# Patient Record
Sex: Female | Born: 1990
Health system: Southern US, Community
[De-identification: ages and names within clinical notes are randomized; demographics above are authoritative.]

## PROBLEM LIST (undated history)

## (undated) DIAGNOSIS — R011 Cardiac murmur, unspecified: Secondary | ICD-10-CM

## (undated) DIAGNOSIS — F419 Anxiety disorder, unspecified: Secondary | ICD-10-CM

## (undated) HISTORY — PX: NO PAST SURGERIES: SHX2092

## (undated) HISTORY — PX: BACK SURGERY: SHX140

## (undated) HISTORY — DX: Anxiety disorder, unspecified: F41.9

---

## 2008-01-15 ENCOUNTER — Emergency Department (HOSPITAL_COMMUNITY): Admission: EM | Admit: 2008-01-15 | Discharge: 2008-01-15 | Payer: Self-pay | Admitting: Emergency Medicine

## 2010-04-18 LAB — POCT I-STAT, CHEM 8
BUN: 6 mg/dL (ref 6–23)
Calcium, Ion: 1.17 mmol/L (ref 1.12–1.32)
Creatinine, Ser: 1 mg/dL (ref 0.4–1.2)
TCO2: 26 mmol/L (ref 0–100)

## 2010-04-18 LAB — URINALYSIS, ROUTINE W REFLEX MICROSCOPIC
Glucose, UA: NEGATIVE mg/dL
Specific Gravity, Urine: 1.013 (ref 1.005–1.030)
Urobilinogen, UA: 1 mg/dL (ref 0.0–1.0)
pH: 7 (ref 5.0–8.0)

## 2010-04-18 LAB — URINE CULTURE

## 2010-04-18 LAB — URINE MICROSCOPIC-ADD ON

## 2010-04-18 LAB — DIFFERENTIAL
Basophils Absolute: 0 10*3/uL (ref 0.0–0.1)
Eosinophils Relative: 0 % (ref 0–5)
Lymphocytes Relative: 14 % (ref 12–46)
Lymphs Abs: 1.5 10*3/uL (ref 0.7–4.0)
Monocytes Absolute: 0.9 10*3/uL (ref 0.1–1.0)

## 2010-04-18 LAB — CBC
HCT: 37.9 % (ref 36.0–46.0)
Hemoglobin: 12 g/dL (ref 12.0–15.0)
MCV: 79.7 fL (ref 78.0–100.0)
RDW: 13.6 % (ref 11.5–15.5)

## 2012-06-20 ENCOUNTER — Encounter (HOSPITAL_COMMUNITY): Payer: Self-pay | Admitting: *Deleted

## 2012-06-20 ENCOUNTER — Emergency Department (INDEPENDENT_AMBULATORY_CARE_PROVIDER_SITE_OTHER)
Admission: EM | Admit: 2012-06-20 | Discharge: 2012-06-20 | Disposition: A | Discharge status: Home / Self Care | Payer: Self-pay | Source: Home / Self Care | Attending: Family Medicine | Admitting: Family Medicine

## 2012-06-20 DIAGNOSIS — K5289 Other specified noninfective gastroenteritis and colitis: Secondary | ICD-10-CM

## 2012-06-20 DIAGNOSIS — K529 Noninfective gastroenteritis and colitis, unspecified: Secondary | ICD-10-CM

## 2012-06-20 HISTORY — DX: Cardiac murmur, unspecified: R01.1

## 2012-06-20 LAB — POCT URINALYSIS DIP (DEVICE)
Hgb urine dipstick: NEGATIVE
Nitrite: NEGATIVE
Urobilinogen, UA: 0.2 mg/dL (ref 0.0–1.0)
pH: 6.5 (ref 5.0–8.0)

## 2012-06-20 MED ORDER — ONDANSETRON 4 MG PO TBDP
4.0000 mg | ORAL_TABLET | Freq: Once | ORAL | Status: AC
  Administered 2012-06-20: 4 mg via ORAL

## 2012-06-20 MED ORDER — PROMETHAZINE HCL 25 MG PO TABS: 25.0000 mg | ORAL_TABLET | Freq: Three times a day (TID) | ORAL | Status: DC | PRN

## 2012-06-20 MED ORDER — LOPERAMIDE HCL 2 MG PO CAPS: 2.0000 mg | ORAL_CAPSULE | Freq: Four times a day (QID) | ORAL | Status: DC | PRN

## 2012-06-20 MED ORDER — OMEPRAZOLE 20 MG PO CPDR: DELAYED_RELEASE_CAPSULE | ORAL | Status: DC

## 2012-06-20 MED ORDER — ONDANSETRON 4 MG PO TBDP
ORAL_TABLET | ORAL | Status: AC
  Filled 2012-06-20: qty 1

## 2012-06-20 NOTE — ED Provider Notes (Signed)
History     CSN: [id number]  Arrival date & time 06/20/12  1542   First MD Initiated Contact with Patient 06/20/12 1557      Chief Complaint  Patient presents with  . Emesis  . Diarrhea    (Consider location/radiation/quality/duration/timing/severity/associated sxs/prior treatment) HPI Comments: 22 year old smoker female. Here complaining of nausea vomiting and diarrhea since waking up this morning. States she has about 5 episodes of vomiting with no blood mostly food content last time about 2 hours ago also has had about 3 episodes of nonbloody diarrhea. Denies abdominal pain. No headache or dizziness. No jaundice. Patient states that today is her birthday and she makes some alcohol drinks last night. She denies being a heavy drinker or recent alcohol binge. No history of jaundice.   Past Medical History  Diagnosis Date  . 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 No pertinent past surgical history.  History reviewed. No pertinent family history.  History  Substance Use Topics  . Smoking status: Current Every Day Smoker -- 0.10 packs/day    Types: Cigarettes  . Smokeless tobacco: Not on file  . Alcohol Use: Yes     Comment: occasinally    OB History   Grav Para Term Preterm Abortions TAB SAB Ect Mult Living                  Review of Systems  Constitutional: Negative for fever, chills, diaphoresis and fatigue.  Respiratory: Negative for cough and shortness of breath.   Gastrointestinal: Positive for vomiting and diarrhea. Negative for abdominal pain and blood in stool.  Genitourinary: Negative for dysuria, frequency, hematuria, flank pain and pelvic pain.  Neurological: Negative for dizziness and headaches.  All other systems reviewed and are negative.    Allergies  Review of patient's allergies indicates no known allergies.  Home Medications   Current Outpatient Rx  Name  Route  Sig  Dispense  Refill  . loperamide  (IMODIUM) 2 MG capsule   Oral   Take 1 capsule (2 mg total) by mouth 4 (four) times daily as needed for diarrhea or loose stools.   12 capsule   0   . omeprazole (PRILOSEC) 20 MG capsule      Take by mouth twice a day for 1 week then once a day when necessary   30 capsule   0   . promethazine (PHENERGAN) 25 MG tablet   Oral   Take 1 tablet (25 mg total) by mouth every 8 (eight) hours as needed for nausea.   20 tablet   0     BP 107/66  Pulse 87  Temp(Src) 98.1 F (36.7 C) (Oral)  Resp 11  SpO2 100%  LMP 06/01/2012  Physical Exam  Nursing note and vitals reviewed. Constitutional: She is oriented to person, place, and time. She appears well-developed and well-nourished. No distress.  HENT:  Head: Normocephalic and atraumatic.  Mouth/Throat: No oropharyngeal exudate.  Dry lips. Mild pharyngeal erythema.  Eyes: Conjunctivae and EOM are normal. Pupils are equal, round, and reactive to light. No scleral icterus.  Neck: Neck supple. No thyromegaly present.  Cardiovascular: Normal rate, regular rhythm and normal heart sounds.   Pulmonary/Chest: Effort normal and breath sounds normal.  Abdominal: Soft. Bowel sounds are normal. She exhibits no distension and no mass. There is no tenderness. There is no rebound and no guarding.  Lymphadenopathy:    She has no cervical adenopathy.  Neurological: She is alert and oriented to person, place, and time.  Skin: No rash noted. She is not diaphoretic.    ED Course  Procedures (including critical care time)  Labs Reviewed  POCT URINALYSIS DIP (DEVICE) - Abnormal; Notable for the following:    Ketones, ur >=160 (*)    Protein, ur 30 (*)    All other components within normal limits  POCT PREGNANCY, URINE   No results found.   1. Gastroenteritis, acute       MDM  Treated here with ondansetron 4 mg SL. Patient reports nausea subsided and was tolerating fluids well prior discharge. Prescribed phenergan, prilosec and  loperamide. Supportive care and red flags that should prompt her return to medical attention discussed with patient and provided in writing.         Sharin Grave, MD 06/21/12 1034

## 2012-06-20 NOTE — ED Notes (Signed)
Fluid challenge not completed yet.  I just gave pt. 1 cup of ice water to sip.  Instructed to let me know if she vomits it up.  Sent to [facility] to try and get urine sample.  Dr. Tressia Danas said pt. Told her she only vomited x 5 and diarrhea x 3.  She said she told me more because she was nauseated at the time.

## 2012-06-20 NOTE — ED Notes (Signed)
Pt. Was able to drink all of water without vomiting and obtained a urine sample.  Urine appears amber colored and concentrated.

## 2012-06-20 NOTE — ED Notes (Signed)
C/o non-stop vomiting ( more than 20) onset this AM with diarrhea x 20.  C/o upper abdominal pain.  Has chills every now and then.  Cna't keep g'ale or crackers down.

## 2012-07-01 ENCOUNTER — Encounter (HOSPITAL_COMMUNITY): Payer: Self-pay | Admitting: *Deleted

## 2012-07-01 ENCOUNTER — Emergency Department (HOSPITAL_COMMUNITY)
Admission: EM | Admit: 2012-07-01 | Discharge: 2012-07-01 | Disposition: A | Discharge status: Home / Self Care | Payer: No Typology Code available for payment source | Attending: Emergency Medicine | Admitting: Emergency Medicine

## 2012-07-01 DIAGNOSIS — M549 Dorsalgia, unspecified: Secondary | ICD-10-CM

## 2012-07-01 DIAGNOSIS — Y9389 Activity, other specified: Secondary | ICD-10-CM | POA: Insufficient documentation

## 2012-07-01 DIAGNOSIS — S199XXA Unspecified injury of neck, initial encounter: Secondary | ICD-10-CM | POA: Insufficient documentation

## 2012-07-01 DIAGNOSIS — Y92410 Unspecified street and highway as the place of occurrence of the external cause: Secondary | ICD-10-CM | POA: Insufficient documentation

## 2012-07-01 DIAGNOSIS — R011 Cardiac murmur, unspecified: Secondary | ICD-10-CM | POA: Insufficient documentation

## 2012-07-01 DIAGNOSIS — S0993XA Unspecified injury of face, initial encounter: Secondary | ICD-10-CM | POA: Insufficient documentation

## 2012-07-01 DIAGNOSIS — F17200 Nicotine dependence, unspecified, uncomplicated: Secondary | ICD-10-CM | POA: Insufficient documentation

## 2012-07-01 DIAGNOSIS — IMO0002 Reserved for concepts with insufficient information to code with codable children: Secondary | ICD-10-CM | POA: Insufficient documentation

## 2012-07-01 MED ORDER — METHOCARBAMOL 500 MG PO TABS: 500.0000 mg | ORAL_TABLET | Freq: Two times a day (BID) | ORAL | Status: DC

## 2012-07-01 MED ORDER — NAPROXEN 375 MG PO TABS: 375.0000 mg | ORAL_TABLET | Freq: Two times a day (BID) | ORAL | Status: DC
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
## 2012-07-02 NOTE — ED Provider Notes (Signed)
Medical screening examination/treatment/procedure(s) were performed by non-physician practitioner and as supervising physician I was immediately available for consultation/collaboration.   Dina Warbington R Hellon Vaccarella, MD 07/02/12 0715 

## 2013-02-24 ENCOUNTER — Encounter (HOSPITAL_COMMUNITY): Payer: Self-pay | Admitting: Emergency Medicine

## 2013-02-24 ENCOUNTER — Emergency Department (HOSPITAL_COMMUNITY)
Admission: EM | Admit: 2013-02-24 | Discharge: 2013-02-24 | Disposition: A | Discharge status: Home / Self Care | Payer: No Typology Code available for payment source | Attending: Emergency Medicine | Admitting: Emergency Medicine

## 2013-02-24 DIAGNOSIS — R5381 Other malaise: Secondary | ICD-10-CM | POA: Insufficient documentation

## 2013-02-24 DIAGNOSIS — B349 Viral infection, unspecified: Secondary | ICD-10-CM

## 2013-02-24 DIAGNOSIS — R5383 Other fatigue: Secondary | ICD-10-CM

## 2013-02-24 DIAGNOSIS — R011 Cardiac murmur, unspecified: Secondary | ICD-10-CM | POA: Insufficient documentation

## 2013-02-24 DIAGNOSIS — R112 Nausea with vomiting, unspecified: Secondary | ICD-10-CM | POA: Insufficient documentation

## 2013-02-24 DIAGNOSIS — B9789 Other viral agents as the cause of diseases classified elsewhere: Secondary | ICD-10-CM | POA: Insufficient documentation

## 2013-02-24 DIAGNOSIS — F17200 Nicotine dependence, unspecified, uncomplicated: Secondary | ICD-10-CM | POA: Insufficient documentation

## 2013-02-24 DIAGNOSIS — Z3202 Encounter for pregnancy test, result negative: Secondary | ICD-10-CM | POA: Insufficient documentation

## 2013-02-24 DIAGNOSIS — A599 Trichomoniasis, unspecified: Secondary | ICD-10-CM | POA: Insufficient documentation

## 2013-02-24 DIAGNOSIS — J029 Acute pharyngitis, unspecified: Secondary | ICD-10-CM

## 2013-02-24 DIAGNOSIS — E860 Dehydration: Secondary | ICD-10-CM | POA: Insufficient documentation

## 2013-02-24 LAB — CBC
HEMATOCRIT: 37.8 % (ref 36.0–46.0)
HEMOGLOBIN: 12.5 g/dL (ref 12.0–15.0)
MCH: 25.4 pg — ABNORMAL LOW (ref 26.0–34.0)
MCHC: 33.1 g/dL (ref 30.0–36.0)
MCV: 76.7 fL — ABNORMAL LOW (ref 78.0–100.0)
Platelets: 262 10*3/uL (ref 150–400)
RBC: 4.93 MIL/uL (ref 3.87–5.11)
RDW: 13.4 % (ref 11.5–15.5)
WBC: 6.4 10*3/uL (ref 4.0–10.5)

## 2013-02-24 LAB — BASIC METABOLIC PANEL
BUN: 8 mg/dL (ref 6–23)
CHLORIDE: 99 meq/L (ref 96–112)
CO2: 22 meq/L (ref 19–32)
Calcium: 9.6 mg/dL (ref 8.4–10.5)
Creatinine, Ser: 0.76 mg/dL (ref 0.50–1.10)
GFR calc Af Amer: >90 mL/min (ref >90)
GLUCOSE: 91 mg/dL (ref 70–99)
POTASSIUM: 4.1 meq/L (ref 3.7–5.3)
SODIUM: 138 meq/L (ref 137–147)

## 2013-02-24 LAB — URINALYSIS, ROUTINE W REFLEX MICROSCOPIC
Glucose, UA: NEGATIVE mg/dL
HGB URINE DIPSTICK: NEGATIVE
Ketones, ur: >80 mg/dL — AB
Nitrite: NEGATIVE
PH: 6.5 (ref 5.0–8.0)
Protein, ur: 30 mg/dL — AB
SPECIFIC GRAVITY, URINE: 1.035 — AB (ref 1.005–1.030)
UROBILINOGEN UA: 1 mg/dL (ref 0.0–1.0)

## 2013-02-24 LAB — URINE MICROSCOPIC-ADD ON

## 2013-02-24 LAB — PREGNANCY, URINE: PREG TEST UR: NEGATIVE

## 2013-02-24 MED ORDER — METRONIDAZOLE 500 MG PO TABS: 500.0000 mg | ORAL_TABLET | Freq: Two times a day (BID) | ORAL | Status: DC

## 2013-02-24 MED ORDER — NAPROXEN 500 MG PO TABS: 500.0000 mg | ORAL_TABLET | Freq: Two times a day (BID) | ORAL | Status: DC

## 2013-02-24 MED ORDER — ONDANSETRON 4 MG PO TBDP
8.0000 mg | ORAL_TABLET | Freq: Once | ORAL | Status: AC
  Administered 2013-02-24: 8 mg via ORAL
  Filled 2013-02-24: qty 2

## 2013-02-24 MED ORDER — PROMETHAZINE HCL 25 MG PO TABS: 25.0000 mg | ORAL_TABLET | Freq: Four times a day (QID) | ORAL | Status: DC | PRN

## 2013-02-24 MED ORDER — BENZONATATE 100 MG PO CAPS: 100.0000 mg | ORAL_CAPSULE | Freq: Three times a day (TID) | ORAL | Status: DC

## 2013-02-24 NOTE — Discharge Instructions (Signed)
Pelvic Infection  If you have been diagnosed with a pelvic infection such as a sexually transmitted disease, you will need to be treated with antibiotics. Please take the medicines as prescribed. Some of these tests do not come back for 1-2 days in which case if they turn positive you will receive a phone call to let you know. If you are contacted and do have an infection consistent with a sexually transmitted disease, then you will need to tell any and all sexual partners that you have had in the last 6 months no so that they can be tested and treated as well. If you should develop severe or worsening pain in your abdomen or the pelvis or develop severe fevers,nausea or vomiting that prevent you from taking your medications, return to the emergency department immediately. Otherwise contact your local physician or county health department for a follow up appointment to complete STD testing including HIV and syphilis.  See the list of phone numbers below.   Emergency Department Resource Guide 1) Find a Doctor and Pay Out of Pocket Although you won't have to find out who is covered by your insurance plan, it is a good idea to ask around and get recommendations. You will then need to call the office and see if the doctor you have chosen will accept you as a new patient and what types of options they offer for patients who are self-pay. Some doctors offer discounts or will set up payment plans for their patients who do not have insurance, but you will need to ask so you aren't surprised when you get to your appointment.  2) Contact Your Local Health Department Not all health departments have doctors that can see patients for sick visits, but many do, so it is worth a call to see if yours does. If you don't know where your local health department is, you can check in your phone book. The CDC also has a tool to help you locate your state's health department, and many state websites also have listings of all of  their local health departments.  3) Find a Walk-in Clinic If your illness is not likely to be very severe or complicated, you may want to try a walk in clinic. These are popping up all over the country in pharmacies, drugstores, and shopping centers. They're usually staffed by nurse practitioners or physician assistants that have been trained to treat common illnesses and complaints. They're usually fairly quick and inexpensive. However, if you have serious medical issues or chronic medical problems, these are probably not your best option.  No Primary Care Doctor: - Call Health Connect at  [phone] - they can help you locate a primary care doctor that  accepts your insurance, provides certain services, etc. - Physician Referral Service- [phone]  Chronic Pain Problems: Organization         Address  Phone   Notes  [facility]  [phone] Patients need to be referred by their primary care doctor.   Medication Assistance: Organization         Address  Phone   Notes  [facility] Medication [facility] [address] [place] [place], [place] [address] [phone] --Must be a resident of [facility] -- Must have NO insurance coverage whatsoever (no Medicaid/ Medicare, etc.) -- The pt. MUST have a primary care doctor that directs their care regularly and follows them in the community   [facility]  [phone]   [address]  (  Agencies that provide inexpensive medical care: °Organization         Address  Phone   Notes  °[facility]  ([phone]) [phone]   °[facility]    ([phone]) [phone]   °[facility] [address] °[place], [place] [address] (336) [phone]   °[facility] [address], °[place] (336) [phone]   °[facility]    (336) [phone]   °[facility]    (336) 272-1050   °[facility] ° [address],  [place] Phone:  ([phone]) [phone], Fax:  ([phone] Hours of Operation:  9 am - 6 pm, M-F.  Also accepts Medicaid/Medicare and self-pay.  °[facility] ° [address], [place] Phone: ([phone]) [phone], Fax: [phone]. Hours of Operation:  8:30 am - 5:30 pm, M-F.  Also accepts Medicaid and self-pay.  °[facility] [place] [address], [place] Phone: ([phone]) [phone]   °[facility] [address], [place], [place] ([phone], Ext. 123 Mondays & Thursdays: 7-9 AM.  First 15 patients are seen on a first come, first serve basis. °  ° °Medicaid-accepting [place] Providers: ° °Organization         Address  Phone   Notes  °[facility] [address], [place] [phone]) [phone] Also accepts self-pay patients.  °[facility] [address], [place] ° ([phone]) [phone]   °[facility] [address], [place] ([phone]) [phone]   °[facility] [address] [place] [address], [place] ([phone]) [phone]   °Veita Bland [address], [place]  ° ([phone]) [phone] Only accepts [place] Medicaid patients after they have their name applied to their card.  ° °Self-Pay (no insurance) in [place]: ° °Organization         Address  Phone   Notes  °Sickle Cell Patients, [facility] [address], [place] ([phone]) [phone]   °[facility] [address], [place] (336) [phone]   °[facility] ° [address], [place] ([phone]) [phone]   °[facility]/Dr. Osei-Bonsu ° [address] [place] [address], [place] or [address], [place] ([phone]) [phone] Phone number for both [place] and [place] locations is the same.  °[facility] [address], [place] ([phone]) [phone]   °[facility] [address] [place] [address], [place] or [address] (336) [phone] °(336) [phone]   °[facility] [address], [place] (336) [phone], phone; ([phone]) [phone], fax Sees patients 1st and 3rd Saturday of every month.  Must not qualify for public or private insurance (i.e. Medicaid, Medicare, [place] Health Choice, Veterans' Benefits) • Household income should be no more than 200% of the poverty level •The clinic cannot treat you if you are pregnant or think you are pregnant • Sexually transmitted diseases are not treated at the clinic.  ° ° °Dental Care: °Organization         Address  Phone  Notes  °[place] [facility] [address], [place] ([phone]) [phone] Accepts children up to age 21 who are enrolled in Medicaid or [place] Health Choice; pregnant women with a Medicaid card; and children who have applied for Medicaid or   have applied for Medicaid or [place] Health Choice, but were declined, whose parents can pay a reduced fee at time of service.  [facility]  [address], [place] [phone] Accepts children up to age 57 who are enrolled in [place] or [place] Health Choice; pregnant women with a Medicaid card; and children who have applied for Medicaid or [place] Health Choice, but were declined, whose parents can pay a reduced fee at time of service.  [facility]  [address] [place], [place] [phone] Patients are seen by appointment only. Walk-ins are not accepted. [facility] will see patients 54 years of age and older. Monday - Tuesday (8am-5pm) Most Wednesdays (8:30-5pm) $30 per visit, cash only  [facility]  [address], [facility] [phone] Patients are seen by appointment only. Walk-ins are not accepted. [facility] will see patients 58 years of age and older. One Wednesday Evening (Monthly: Volunteer Based).  $30 per visit, cash only  [employer]   [phone] for adults; Children under age 72, call Graduate Pediatric Dentistry at [phone]. Children aged 19-14, please call [phone] to request a pediatric application.  Dental services are provided in all areas of dental care including fillings, crowns and bridges, complete and partial dentures, implants, gum treatment, root canals, and extractions. Preventive care is also provided. Treatment is provided to both adults and children. Patients are selected via a lottery and there is often a waiting list.   [facility] [address], [place]  ([phone] [email or web address]   [facility] [address] [place], [place] [phone]) [phone], Ext. 123 Second and Fourth Thursday of each month, opens at 6:30 AM; Clinic ends at 9 AM.  Patients are seen on a first-come first-served basis, and a limited number are seen during each clinic.   [facility]  [address] [place], [place] [phone]   Eligibility Requirements You must have lived in [place], [place], or [place] counties for at least the last three months.   You cannot be eligible for state or federal sponsored [place], including [employer], [place], or [employer].   You generally cannot be eligible for healthcare insurance through your employer.    How to apply: Eligibility screenings are held every Tuesday and Wednesday afternoon from 1:00 pm until 4:00 pm. You do not need an appointment for the interview!  [facility] [address] [place], [place] [phone]   [facility] Health Department  [phone]   [facility] Health Department  [phone]   [facility] Health Department  [phone]    Behavioral Health Resources in the Community: Intensive Outpatient Programs Organization         Address  Phone  Notes  [facility] [address] [place], [place] [phone]   [facility] [address], [place], [place] [phone]   [facility] [address], [place], [place]  [phone]   [facility] Health [address],  [place], [place] [phone] or 8640714858   Substance Abuse Resources Organization         Address  Phone  Notes  [facility]  [phone]   [facility]  [phone]   [facility]  [phone]   [facility]  9178133752   Residential & Outpatient  Substance Abuse Program  [phone]   Psychological Services Organization         Address  Phone  Notes  [facility]  [phone]- [phone]   [facility]  [phone]- [phone]   [facility] [phone] N[address], [place] [phone] or [phone]    Mobile Crisis Teams Organization         Address  Phone  Notes  [facility], Mobile Crisis Care Unit  [phone]   [facility]  [address] [place], [place] [phone]   Doristine LocksSharon DeEsch [address] [place] [phone]    Self-Help/Support Groups Organization         Address  Phone             Notes  [facility] [place] - variety of support groups  336- I7437963([phone] Call for more information  Narcotics Anonymous (NA), [facility] [address], [place]  2 meetings at this location   [facility]ian[facility]idential Treatment Programs Organization         Address  Phone  Notes  [facility] [address],    [place]  [phone]   [facility]  [address], [place] [phone], [place], [place] [phone]   [facility] [address], [place] [phone] Admissions: 8am-3pm M-F  [facility] [address]    [place], [place] [phone]   [facility] [address], [place], [place] [phone]   [facility] [address]  [place], [place] [phone]     [facility] - Intensive Outpatient [address], [place], [place] [phone]   [facility] ([facility] [address]  [place], [place] [phone] or [phone]   [facility] ([facility]) [address] [place], [place] [phone] Accepts Medicaid  [facility] [address]  [place] [phone] Substance Abuse/Addiction Treatment   [facility] Resources Organization         Address  Phone  Notes  [facility]  [phone]([phone]) [phone]   Angie FavaJulie Brannon, PhD [address], [address] [place], [place]   [phone] or [phone](336) [phone]   [facility]   [address] [place], [place] [phone]) [phone]   [facility] [address], [place], [place] [phone] Insurance/Medicaid/sponsorship through [facility]  [facility] [address]                                    [place], [place] [phone]) [phone] Therapy/tele-psych/case  [facility] [address].   [place], [place] [phone]) [phone]    Dr. Lolly MustacheArfeen  [phone]) [phone]   [facility]  [employer] [facility] 1) [address], [place] 2) [address], [place] 3)  [address], [place] [phone]) [phone]  [phone](336) [phone]   [facility] Child Abuse Hotline [phone](336) [phone] or [phone](336) [phone] (After Hours)

## 2013-02-24 NOTE — ED Notes (Signed)
The tp is c/o abd pain with nv no diarrhea since this am.  lmp 2 weeks ago

## 2013-02-24 NOTE — ED Provider Notes (Signed)
CSN: [id number]     Arrival date & time 02/24/13  0010 History   First MD Initiated Contact with Patient 02/24/13 [phone]     Chief Complaint  Patient presents with  . Abdominal Pain     (Consider location/radiation/quality/duration/timing/severity/associated sxs/prior Treatment) HPI Comments: Pt presents with one day of symtpoms  CC is sore throat and abd pain Associated weakness - diffuse - worse with standing, associated nausea and post tussive emesis - non productive cough, no swelling, no rashes, no diarrhea, no fevers no dysuria, no vaginal discharge  Sx are persistent, nothing makes better or worse.  No sick contacts  Patient is a 23 y.o. female presenting with abdominal pain. The history is provided by the patient and a friend.  Abdominal Pain   Past Medical History  Diagnosis Date  . 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 No pertinent past surgical history. No family history on file. History  Substance Use Topics  . Smoking status: Current Every Day Smoker -- 0.10 packs/day    Types: Cigarettes  . Smokeless tobacco: Not on file  . Alcohol Use: Yes     Comment: occasinally   OB History   Grav Para Term Preterm Abortions TAB SAB Ect Mult Living                 Review of Systems  Gastrointestinal: Positive for abdominal pain.  All other systems reviewed and are negative.      Allergies  Review of patient's allergies indicates no known allergies.  Home Medications   Current Outpatient Rx  Name  Route  Sig  Dispense  Refill  . acetaminophen (TYLENOL) 325 MG tablet   Oral   Take 650 mg by mouth every 6 (six) hours as needed for pain (pain/headache).         . benzonatate (TESSALON) 100 MG capsule   Oral   Take 1 capsule (100 mg total) by mouth every 8 (eight) hours.   21 capsule   0   . methocarbamol (ROBAXIN) 500 MG tablet   Oral   Take 1 tablet (500 mg total) by mouth 2 (two) times daily.   20 tablet    0   . metroNIDAZOLE (FLAGYL) 500 MG tablet   Oral   Take 1 tablet (500 mg total) by mouth 2 (two) times daily.   14 tablet   0   . naproxen (NAPROSYN) 375 MG tablet   Oral   Take 1 tablet (375 mg total) by mouth 2 (two) times daily.   20 tablet   0   . naproxen (NAPROSYN) 500 MG tablet   Oral   Take 1 tablet (500 mg total) by mouth 2 (two) times daily with a meal.   30 tablet   0   . promethazine (PHENERGAN) 25 MG tablet   Oral   Take 1 tablet (25 mg total) by mouth every 6 (six) hours as needed for nausea or vomiting.   12 tablet   0    BP 97/60  Pulse 98  Temp(Src) 98.8 F (37.1 C) (Oral)  Resp 18  Ht 5\' 6"  (1.676 m)  Wt 151 lb 1 oz (68.522 kg)  BMI 24.39 kg/m2  SpO2 99%  LMP 02/10/2013 Physical Exam  Nursing note and vitals reviewed. Constitutional: She appears well-developed and well-nourished. No distress.  HENT:  Head: Normocephalic and atraumatic.  Mouth/Throat: Oropharynx is clear and moist. No oropharyngeal exudate.  Eyes: Conjunctivae and EOM are normal. Pupils are equal, round, and reactive to light. Right eye exhibits no discharge. Left eye exhibits no discharge. No scleral icterus.  Neck: Normal range of motion. Neck supple. No JVD present. No thyromegaly present.  Cardiovascular: Normal rate, regular rhythm, normal heart sounds and intact distal pulses.  Exam reveals no gallop and no friction rub.   No murmur heard. Pulmonary/Chest: Effort normal and breath sounds normal. No respiratory distress. She has no wheezes. She has no rales.  Abdominal: Soft. Bowel sounds are normal. She exhibits no distension and no mass. There is no tenderness.  Musculoskeletal: Normal range of motion. She exhibits no edema and no tenderness.  Lymphadenopathy:    She has no cervical adenopathy.  Neurological: She is alert. Coordination normal.  Skin: Skin is warm and dry. No rash noted. No erythema.  Psychiatric: She has a normal mood and affect. Her behavior is normal.     ED Course  Procedures (including critical care time) Labs Review Labs Reviewed  CBC - Abnormal; Notable for the following:    MCV 76.7 (*)    MCH 25.4 (*)    All other components within normal limits  URINALYSIS, ROUTINE W REFLEX MICROSCOPIC - Abnormal; Notable for the following:    Color, Urine AMBER (*)    APPearance CLOUDY (*)    Specific Gravity, Urine 1.035 (*)    Bilirubin Urine SMALL (*)    Ketones, ur >80 (*)    Protein, ur 30 (*)    Leukocytes, UA SMALL (*)    All other components within normal limits  URINE MICROSCOPIC-ADD ON - Abnormal; Notable for the following:    Squamous Epithelial / LPF MANY (*)    Bacteria, UA FEW (*)    All other components within normal limits  BASIC METABOLIC PANEL  PREGNANCY, URINE   Imaging Review No results found.  EKG Interpretation   None       MDM   Final diagnoses:  Viral syndrome  Pharyngitis  Dehydration  Trichomonas infection    The patient is not tachycardic, has no abdominal tenderness, has no rashes and has a normal mental status and neurologic exam. By history it appears that she has a viral illness with pharyngitis, cough, nausea as well as generalized weakness. Laboratory workup shows ketonuria, trichomoniasis was seen in the urine but metabolic panel shows normal renal function, normal electrolytes and blood counts are normal as well. The patient appears very stable, she declines IV fluids when offered, declines medications, agreeable to treatment with Flagyl for her Trichomonas, her sexual partner is in the room with her and I will write a prescription for them as well.  Meds given in ED:  Medications  ondansetron (ZOFRAN-ODT) disintegrating tablet 8 mg (8 mg Oral Given 02/24/13 0019)    New Prescriptions   BENZONATATE (TESSALON) 100 MG CAPSULE    Take 1 capsule (100 mg total) by mouth every 8 (eight) hours.   METRONIDAZOLE (FLAGYL) 500 MG TABLET    Take 1 tablet (500 mg total) by mouth 2 (two) times  daily.   NAPROXEN (NAPROSYN) 500 MG TABLET    Take 1 tablet (500 mg total) by mouth 2 (two) times daily with a meal.   PROMETHAZINE (PHENERGAN) 25 MG TABLET    Take 1 tablet (25 mg total) by mouth every 6 (six) hours as needed for nausea or vomiting.        Vida RollerBrian D Kadeja Granada, MD 02/24/13 [phone]

## 2018-02-04 ENCOUNTER — Encounter (HOSPITAL_COMMUNITY): Payer: Self-pay | Admitting: Emergency Medicine

## 2018-02-04 ENCOUNTER — Emergency Department (HOSPITAL_COMMUNITY)
Admission: EM | Admit: 2018-02-04 | Discharge: 2018-02-04 | Disposition: A | Discharge status: Home / Self Care | Payer: Medicaid Other | Attending: Emergency Medicine | Admitting: Emergency Medicine

## 2018-02-04 DIAGNOSIS — F17210 Nicotine dependence, cigarettes, uncomplicated: Secondary | ICD-10-CM | POA: Insufficient documentation

## 2018-02-04 DIAGNOSIS — K0889 Other specified disorders of teeth and supporting structures: Secondary | ICD-10-CM

## 2018-02-04 DIAGNOSIS — R52 Pain, unspecified: Secondary | ICD-10-CM | POA: Diagnosis not present

## 2018-02-04 DIAGNOSIS — Z79899 Other long term (current) drug therapy: Secondary | ICD-10-CM | POA: Insufficient documentation

## 2018-02-04 MED ORDER — IBUPROFEN 600 MG PO TABS: 600.0000 mg | ORAL_TABLET | Freq: Four times a day (QID) | ORAL | 0 refills | Status: DC | PRN

## 2018-02-04 MED ORDER — ACETAMINOPHEN 500 MG PO TABS: 500.0000 mg | ORAL_TABLET | Freq: Four times a day (QID) | ORAL | 0 refills | Status: DC | PRN

## 2018-02-04 MED ORDER — PENICILLIN V POTASSIUM 500 MG PO TABS: 500.0000 mg | ORAL_TABLET | Freq: Four times a day (QID) | ORAL | 0 refills | Status: AC

## 2018-02-04 MED ORDER — LIDOCAINE VISCOUS HCL 2 % MT SOLN: 15.0000 mL | OROMUCOSAL | 0 refills | Status: DC | PRN

## 2018-02-04 MED ORDER — PENICILLIN V POTASSIUM 250 MG PO TABS
500.0000 mg | ORAL_TABLET | Freq: Once | ORAL | Status: AC
  Administered 2018-02-04: 500 mg via ORAL
  Filled 2018-02-04: qty 2

## 2018-02-04 NOTE — ED Provider Notes (Signed)
[facility] EMERGENCY DEPARTMENT Provider Note   CSN: [id number] Arrival date & time: 02/04/18  0609     History   Chief Complaint Chief Complaint  Patient presents with  . Dental Pain    HPI Courtney Mendez is a 28 y.o. female presents for evaluation of acute onset, constant left maxillary dental pain for 2 to 3 days.  She reports that pain began a few days after breaking a tooth while eating.  She has a history of chronic dental issues but has not seen a dentist in 8 years.  Reports pain is constant, throbbing and aching, worsens with exposure to air or when her tongue brushes up against the tooth.  Radiates up to the left ear.  Denies fevers, difficulty breathing or swallowing, or drooling.  Has been taking BC powder and ibuprofen with minimal relief.  The history is provided by the patient.    Past Medical History:  Diagnosis Date  . 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   History reviewed. No pertinent surgical history.   OB History   No obstetric history on file.      Home Medications    Prior to Admission medications   Medication Sig Start Date End Date Taking? Authorizing Provider  acetaminophen (TYLENOL) 500 MG tablet Take 1 tablet (500 mg total) by mouth every 6 (six) hours as needed. 02/04/18   Aman Bonet A, PA-C  benzonatate (TESSALON) 100 MG capsule Take 1 capsule (100 mg total) by mouth every 8 (eight) hours. 02/24/13   Eber Hong, MD  ibuprofen (ADVIL,MOTRIN) 600 MG tablet Take 1 tablet (600 mg total) by mouth every 6 (six) hours as needed. 02/04/18   Jaidin Ugarte A, PA-C  lidocaine (XYLOCAINE) 2 % solution Use as directed 15 mLs in the mouth or throat as needed for mouth pain (swish and spit). 02/04/18   Kiaja Shorty A, PA-C  methocarbamol (ROBAXIN) 500 MG tablet Take 1 tablet (500 mg total) by mouth 2 (two) times daily. 07/01/12   Santiago Glad, PA-C    metroNIDAZOLE (FLAGYL) 500 MG tablet Take 1 tablet (500 mg total) by mouth 2 (two) times daily. 02/24/13   Eber Hong, MD  naproxen (NAPROSYN) 375 MG tablet Take 1 tablet (375 mg total) by mouth 2 (two) times daily. 07/01/12   Santiago Glad, PA-C  naproxen (NAPROSYN) 500 MG tablet Take 1 tablet (500 mg total) by mouth 2 (two) times daily with a meal. 02/24/13   Eber Hong, MD  penicillin v potassium (VEETID) 500 MG tablet Take 1 tablet (500 mg total) by mouth 4 (four) times daily for 7 days. 02/04/18 02/11/18  Michela Pitcher A, PA-C  promethazine (PHENERGAN) 25 MG tablet Take 1 tablet (25 mg total) by mouth every 6 (six) hours as needed for nausea or vomiting. 02/24/13   Eber Hong, MD    Family History History reviewed. No pertinent family history.  Social History Social History   Tobacco Use  . Smoking status: Current Every Day Smoker    Packs/day: 0.10    Types: Cigarettes  Substance Use Topics  . Alcohol use: Yes    Comment: occasinally  . Drug use: Yes    Frequency: 3.0 times per week    Types: Marijuana     Allergies   Patient has no known allergies.   Review of Systems Review of Systems  Constitutional: Negative for  chills and fever.  HENT: Positive for dental problem. Negative for drooling.      Physical Exam Updated Vital Signs BP (!) 98/56 (BP Location: Right Arm)   Pulse 74   Temp 98.1 F (36.7 C) (Oral)   Resp 16   SpO2 99%   Physical Exam Vitals signs and nursing note reviewed.  Constitutional:      General: She is not in acute distress.    Appearance: Normal appearance. She is well-developed.  HENT:     Head: Normocephalic and atraumatic.     Mouth/Throat:     Mouth: Mucous membranes are moist.     Pharynx: No oropharyngeal exudate or posterior oropharyngeal erythema.     Comments: Diffusely decaying dentition with multiple teeth cracked to the base with exposed dentin.  Tenderness to percussion overlying the left second maxillary molar.  Some  gingival irritation noted. Dentition appears to be stable. No noted area of fluctuance. No trismus. Mouth opening to at least 3 finger widths. Handles oral secretions without difficulty. No noted facial swelling. No swelling or tenderness to the submental or submandibular regions. No swelling or tenderness into the soft tissues of the neck.   Eyes:     General:        Right eye: No discharge.        Left eye: No discharge.     Conjunctiva/sclera: Conjunctivae normal.  Neck:     Musculoskeletal: Normal range of motion and neck supple. No neck rigidity.     Vascular: No JVD.     Trachea: No tracheal deviation.  Cardiovascular:     Rate and Rhythm: Normal rate.  Pulmonary:     Effort: Pulmonary effort is normal.  Abdominal:     General: There is no distension.  Lymphadenopathy:     Cervical: No cervical adenopathy.  Skin:    General: Skin is warm and dry.     Findings: No erythema.  Neurological:     Mental Status: She is alert.  Psychiatric:        Behavior: Behavior normal.      ED Treatments / Results  Labs (all labs ordered are listed, but only abnormal results are displayed) Labs Reviewed - No data to display  EKG None  Radiology No results found.  Procedures Procedures (including critical care time)  Medications Ordered in ED Medications  penicillin v potassium (VEETID) tablet 500 mg (has no administration in time range)     Initial Impression / Assessment and Plan / ED Course  I have reviewed the triage vital signs and the nursing notes.  Pertinent labs & imaging results that were available during my care of the patient were reviewed by me and considered in my medical decision making (see chart for details).     Patient with toothache.  Patient is afebrile, vital signs are stable.  She is nontoxic but uncomfortable in appearance.  No gross abscess.  Exam unconcerning for Ludwig's angina, retropharyngeal abscess, peritonsillar abscess, or spread of  infection.  Tolerating secretions without difficulty.  Airway appears patent.  Will treat with penicillin, NSAIDs, Tylenol, and viscous lidocaine.  Urged patient to follow-up with dentist, and she was given resources to follow-up with a dentist on outpatient basis.  Discussed strict ED return precautions. Pt verbalized understanding of and agreement with plan and is safe for discharge home at this time.   Final Clinical Impressions(s) / ED Diagnoses   Final diagnoses:  Pain, dental    ED Discharge Orders  Ordered    acetaminophen (TYLENOL) 500 MG tablet  Every 6 hours PRN     02/04/18 0726    ibuprofen (ADVIL,MOTRIN) 600 MG tablet  Every 6 hours PRN     02/04/18 0726    penicillin v potassium (VEETID) 500 MG tablet  4 times daily     02/04/18 0726    lidocaine (XYLOCAINE) 2 % solution  As needed     02/04/18 0726           Jeanie SewerFawze, Maritta Kief A, PA-C 02/04/18 0727    Wynetta FinesMessick, Peter C, MD 02/05/18 [id number]

## 2018-02-04 NOTE — ED Triage Notes (Signed)
Pt reports chronic dental issues. Tooth decay, teeth falling out, etc. Does not have dentist.

## 2018-02-04 NOTE — Discharge Instructions (Signed)
Please take all of your antibiotics until finished!   You may develop abdominal discomfort or diarrhea from the antibiotic.  You may help offset this with probiotics which you can buy or get in yogurt. Do not eat  or take the probiotics until 2 hours after your antibiotic.   Apply warm or cool compresses to jaw throughout the day. Alternate 600 mg of ibuprofen and [phone] mg of Tylenol every 3-4 hours as needed for pain. Do not exceed 4000 mg of Tylenol daily.  You may also use warm water salt gargles, Orajel, or other over-the-counter dental pain remedies.  Take viscous lidocaine as needed for pain.  Swish and spit this medicine, do not swallow it. Followup with a dentist is very important for ongoing evaluation and management of recurrent dental pain.  I have given you some resources for follow-up with a dentist on an outpatient basis.  Return to emergency department for emergent changing or worsening symptoms such as fever, worsening facial swelling, difficulty breathing or swallowing, throat tightness, or vision changes.

## 2018-02-04 NOTE — ED Notes (Signed)
Patient verbalizes understanding of discharge instructions. Opportunity for questioning and answers were provided. Armband removed by staff, pt discharged from ED.  

## 2018-03-06 DIAGNOSIS — N760 Acute vaginitis: Secondary | ICD-10-CM | POA: Diagnosis not present

## 2018-03-06 DIAGNOSIS — Z3202 Encounter for pregnancy test, result negative: Secondary | ICD-10-CM | POA: Diagnosis not present

## 2018-03-06 DIAGNOSIS — Z30011 Encounter for initial prescription of contraceptive pills: Secondary | ICD-10-CM | POA: Diagnosis not present

## 2018-03-06 DIAGNOSIS — Z01419 Encounter for gynecological examination (general) (routine) without abnormal findings: Secondary | ICD-10-CM | POA: Diagnosis not present

## 2018-10-09 DIAGNOSIS — F4310 Post-traumatic stress disorder, unspecified: Secondary | ICD-10-CM | POA: Diagnosis not present

## 2018-10-09 DIAGNOSIS — F332 Major depressive disorder, recurrent severe without psychotic features: Secondary | ICD-10-CM | POA: Diagnosis not present

## 2018-10-26 ENCOUNTER — Emergency Department (HOSPITAL_COMMUNITY)
Admission: EM | Admit: 2018-10-26 | Discharge: 2018-10-26 | Disposition: A | Discharge status: Home / Self Care | Payer: Medicaid Other | Attending: Emergency Medicine | Admitting: Emergency Medicine

## 2018-10-26 ENCOUNTER — Emergency Department (HOSPITAL_COMMUNITY): Payer: Medicaid Other

## 2018-10-26 ENCOUNTER — Encounter (HOSPITAL_COMMUNITY): Payer: Self-pay

## 2018-10-26 DIAGNOSIS — Z79899 Other long term (current) drug therapy: Secondary | ICD-10-CM | POA: Diagnosis not present

## 2018-10-26 DIAGNOSIS — M546 Pain in thoracic spine: Secondary | ICD-10-CM | POA: Insufficient documentation

## 2018-10-26 DIAGNOSIS — M5489 Other dorsalgia: Secondary | ICD-10-CM | POA: Diagnosis not present

## 2018-10-26 DIAGNOSIS — M25519 Pain in unspecified shoulder: Secondary | ICD-10-CM | POA: Diagnosis not present

## 2018-10-26 DIAGNOSIS — F17210 Nicotine dependence, cigarettes, uncomplicated: Secondary | ICD-10-CM | POA: Diagnosis not present

## 2018-10-26 DIAGNOSIS — M25511 Pain in right shoulder: Secondary | ICD-10-CM | POA: Insufficient documentation

## 2018-10-26 DIAGNOSIS — R52 Pain, unspecified: Secondary | ICD-10-CM | POA: Diagnosis not present

## 2018-10-26 MED ORDER — NAPROXEN 500 MG PO TABS: 500.0000 mg | ORAL_TABLET | Freq: Two times a day (BID) | ORAL | 0 refills | Status: AC

## 2018-10-26 MED ORDER — IBUPROFEN 200 MG PO TABS
400.0000 mg | ORAL_TABLET | Freq: Once | ORAL | Status: AC
  Administered 2018-10-26: 14:00:00 400 mg via ORAL
  Filled 2018-10-26: qty 2

## 2018-10-26 MED ORDER — TRAMADOL HCL 50 MG PO TABS
50.0000 mg | ORAL_TABLET | Freq: Once | ORAL | Status: AC
  Administered 2018-10-26: 50 mg via ORAL
  Filled 2018-10-26: qty 1

## 2018-10-26 MED ORDER — TRAMADOL HCL 50 MG PO TABS: 50.0000 mg | ORAL_TABLET | Freq: Four times a day (QID) | ORAL | 0 refills | Status: DC | PRN

## 2018-10-26 NOTE — Discharge Instructions (Signed)
As discussed, with your shoulder pain and there is concern for soft tissue injury, possibly rotator cuff damage. Please schedule follow-up with our orthopedic colleagues.  Take all medication as directed and do not hesitate to return here should you develop new, or concerning changes in your condition.

## 2018-10-26 NOTE — ED Provider Notes (Signed)
[facility] DEPT Provider Note   CSN: [id number] Arrival date & time: 10/26/18  1020     History   Chief Complaint Chief Complaint  Patient presents with  . Back Pain  . Shoulder Pain    HPI Courtney Mendez is a 28 y.o. female.     HPI Patient presents with concern of shoulder pain. She notes that she has had prior injuries to the right arm, but was in her usual state of health until about 2 days ago. Now over the past 2 days she has had persistent sharp severe pain minimally improved with OTC medication with Tylenol and ibuprofen.  The pain is focally about the shoulder, but also present in the superior axilla, upper lateral right back as well. No dyspnea, no other chest pain, no syncope. She notes that prior to the onset she was moving a heavy piece of furniture.  Past Medical History:  Diagnosis Date  . 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   History reviewed. No pertinent surgical history.   OB History   No obstetric history on file.      Home Medications    Prior to Admission medications   Medication Sig Start Date End Date Taking? Authorizing Provider  acetaminophen (TYLENOL) 500 MG tablet Take 1 tablet (500 mg total) by mouth every 6 (six) hours as needed. 02/04/18   Fawze, Mina A, PA-C  benzonatate (TESSALON) 100 MG capsule Take 1 capsule (100 mg total) by mouth every 8 (eight) hours. 02/24/13   Noemi Chapel, MD  lidocaine (XYLOCAINE) 2 % solution Use as directed 15 mLs in the mouth or throat as needed for mouth pain (swish and spit). 02/04/18   Fawze, Mina A, PA-C  methocarbamol (ROBAXIN) 500 MG tablet Take 1 tablet (500 mg total) by mouth 2 (two) times daily. 07/01/12   Hyman Bible, PA-C  metroNIDAZOLE (FLAGYL) 500 MG tablet Take 1 tablet (500 mg total) by mouth 2 (two) times daily. 02/24/13   Noemi Chapel, MD  naproxen (NAPROSYN) 500 MG tablet  Take 1 tablet (500 mg total) by mouth 2 (two) times daily with a meal for 7 days. 10/26/18 11/02/18  Carmin Muskrat, MD  promethazine (PHENERGAN) 25 MG tablet Take 1 tablet (25 mg total) by mouth every 6 (six) hours as needed for nausea or vomiting. 02/24/13   Noemi Chapel, MD  traMADol (ULTRAM) 50 MG tablet Take 1 tablet (50 mg total) by mouth every 6 (six) hours as needed. 10/26/18   Carmin Muskrat, MD    Family History History reviewed. No pertinent family history.  Social History Social History   Tobacco Use  . Smoking status: Current Every Day Smoker    Packs/day: 0.10    Types: Cigarettes  Substance Use Topics  . Alcohol use: Yes    Comment: occasinally  . Drug use: Yes    Frequency: 3.0 times per week    Types: Marijuana     Allergies   Patient has no known allergies.   Review of Systems Review of Systems  Constitutional: Negative for fever.  Respiratory: Negative for shortness of breath.   Cardiovascular: Negative for chest pain.  Musculoskeletal:       Negative aside from HPI  Skin:       Negative aside from HPI  Allergic/Immunologic: Negative for immunocompromised state.  Neurological: Negative for weakness.     Physical  Exam Updated Vital Signs BP 104/74   Pulse 68   Temp 97.7 F (36.5 C) (Oral)   Resp 16   Wt 68.5 kg   LMP 09/30/2018   SpO2 100%   BMI 24.37 kg/m   Physical Exam Vitals signs and nursing note reviewed.  Constitutional:      General: She is not in acute distress.    Appearance: She is well-developed.  HENT:     Head: Normocephalic and atraumatic.  Eyes:     Conjunctiva/sclera: Conjunctivae normal.  Cardiovascular:     Rate and Rhythm: Normal rate and regular rhythm.     Pulses: Normal pulses.  Pulmonary:     Effort: Pulmonary effort is normal. No respiratory distress.     Breath sounds: Normal breath sounds. No stridor.  Abdominal:     General: There is no distension.  Musculoskeletal:     Right shoulder: She  exhibits tenderness, bony tenderness, crepitus and pain. She exhibits normal range of motion, no swelling, no effusion, no deformity, no spasm and normal strength.     Right elbow: Normal.    Comments: Rotator cuff impingement test positive, range of motion passively unremarkable, and patient does move the arm freely.  Skin:    General: Skin is warm and dry.  Neurological:     Mental Status: She is alert and oriented to person, place, and time.     Cranial Nerves: No cranial nerve deficit.      ED Treatments / Results   Radiology Dg Shoulder Right  Result Date: 10/26/2018 CLINICAL DATA:  Pain after moving heavy object EXAM: RIGHT SHOULDER - 2+ VIEW COMPARISON:  None. FINDINGS: Oblique, Y scapular, and axillary images were obtained. No appreciable fracture or dislocation. Joint spaces appear unremarkable. No erosive change. Visualized right lung clear. IMPRESSION: No evident fracture or dislocation.  No appreciable arthropathy. Electronically Signed   By: Bretta Bang III M.D.   On: 10/26/2018 11:23    Procedures Procedures (including critical care time)  Medications Ordered in ED Medications  ibuprofen (ADVIL) tablet 400 mg (has no administration in time range)  traMADol (ULTRAM) tablet 50 mg (has no administration in time range)     Initial Impression / Assessment and Plan / ED Course  I have reviewed the triage vital signs and the nursing notes.  Pertinent labs & imaging results that were available during my care of the patient were reviewed by me and considered in my medical decision making (see chart for details).  After the initial evaluation I reviewed the patient's x-ray with the patient herself in the exam room.  We discussed the absence of evidence for dislocation, fracture, concern for soft tissue injury, possible rotator cuff damage. Patient will have sling immobilizer, anti-inflammatories, analgesia, follow-up with our orthopedic colleagues.  Final Clinical  Impressions(s) / ED Diagnoses   Final diagnoses:  Acute pain of right shoulder    ED Discharge Orders         Ordered    naproxen (NAPROSYN) 500 MG tablet  2 times daily with meals     10/26/18 1342    traMADol (ULTRAM) 50 MG tablet  Every 6 hours PRN     10/26/18 1342           Gerhard Munch, MD 10/26/18 1344

## 2018-10-26 NOTE — ED Triage Notes (Signed)
Pt BIBA from home. Pt c/o 10/10 right sided back pain and right shoulder pain after moving a couch 2 days ago. Pt states she had a shoulder dislocation 2 months ago in the same shoulder.

## 2018-12-25 DIAGNOSIS — M25511 Pain in right shoulder: Secondary | ICD-10-CM | POA: Diagnosis not present

## 2019-09-22 ENCOUNTER — Ambulatory Visit (INDEPENDENT_AMBULATORY_CARE_PROVIDER_SITE_OTHER): Payer: Medicaid Other | Admitting: Primary Care

## 2019-09-30 ENCOUNTER — Encounter (INDEPENDENT_AMBULATORY_CARE_PROVIDER_SITE_OTHER): Payer: Self-pay | Admitting: Primary Care

## 2019-09-30 ENCOUNTER — Ambulatory Visit (INDEPENDENT_AMBULATORY_CARE_PROVIDER_SITE_OTHER): Payer: Medicaid Other | Admitting: Primary Care

## 2019-09-30 ENCOUNTER — Other Ambulatory Visit: Payer: Self-pay

## 2019-09-30 VITALS — BP 105/72 | HR 79 | Temp 97.3°F | Ht 65.0 in | Wt 114.4 lb

## 2019-09-30 DIAGNOSIS — Z7689 Persons encountering health services in other specified circumstances: Secondary | ICD-10-CM | POA: Diagnosis not present

## 2019-09-30 DIAGNOSIS — M25511 Pain in right shoulder: Secondary | ICD-10-CM | POA: Diagnosis not present

## 2019-09-30 DIAGNOSIS — M25512 Pain in left shoulder: Secondary | ICD-10-CM

## 2019-09-30 MED ORDER — METHOCARBAMOL 500 MG PO TABS: 500.0000 mg | ORAL_TABLET | Freq: Four times a day (QID) | ORAL | 0 refills | Status: DC

## 2019-09-30 NOTE — Progress Notes (Signed)
New Patient Office Visit  Subjective:  Patient ID: Courtney Mendez, female    DOB: [birth year]  Age: 29 y.o. MRN: [id number]  CC:  Chief Complaint  Patient presents with  . New Patient (Initial Visit)    bilateral shoulder pain    HPI Ms.Courtney Mendez is a 29 year old female  presents for establishment of care. She voices pain bilateral shoulder and collar bone area. Treat for rotator cuff previously.  Past Medical History:  Diagnosis Date  . 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 No pertinent surgical history.  History reviewed. No pertinent family history.  Social History   Socioeconomic History  . Marital status: Single    Spouse name: Not on file  . Number of children: Not on file  . Years of education: Not on file  . Highest education level: Not on file  Occupational History  . Not on file  Tobacco Use  . Smoking status: Current Every Day Smoker    Packs/day: 0.10    Types: Cigarettes  . Smokeless tobacco: Never Used  Substance and Sexual Activity  . Alcohol use: Yes    Comment: occasinally  . Drug use: Yes    Frequency: 3.0 times per week    Types: Marijuana  . Sexual activity: Yes    Birth control/protection: None  Other Topics Concern  . Not on file  Social History Narrative  . Not on file   Social Determinants of Health   Financial Resource Strain:   . Difficulty of Paying Living Expenses: Not on file  Food Insecurity:   . Worried About Charity fundraiser in the Last Year: Not on file  . Ran Out of Food in the Last Year: Not on file  Transportation Needs:   . Lack of Transportation (Medical): Not on file  . Lack of Transportation (Non-Medical): Not on file  Physical Activity:   . Days of Exercise per Week: Not on file  . Minutes of Exercise per Session: Not on file  Stress:   . Feeling of Stress : Not on file  Social Connections:   . Frequency of Communication with Friends and Family: Not on file   . Frequency of Social Gatherings with Friends and Family: Not on file  . Attends Religious Services: Not on file  . Active Member of Clubs or Organizations: Not on file  . Attends Archivist Meetings: Not on file  . Marital Status: Not on file  Intimate Partner Violence:   . Fear of Current or Ex-Partner: Not on file  . Emotionally Abused: Not on file  . Physically Abused: Not on file  . Sexually Abused: Not on file    ROS Review of Systems  Musculoskeletal:       Pain bilateral shoulder and collar bone area.  Neurological:       Numbness and tingling left hand at times unable to pick up objects or grasp  All other systems reviewed and are negative.   Objective:   Today's Vitals: BP 105/72 (BP Location: Right Arm, Patient Position: Sitting, Cuff Size: Normal)   Pulse 79   Temp (!) 97.3 F (36.3 C) (Temporal)   Ht 5' 5"  (1.651 m)   Wt 114 lb 6.4 oz (51.9 kg)   LMP 09/10/2019 (Exact Date)   SpO2 97%   BMI 19.04 kg/m   Physical Exam Vitals reviewed.  Constitutional:      Appearance: Normal  appearance.  HENT:     Head: Normocephalic.     Right Ear: Tympanic membrane normal.     Left Ear: Tympanic membrane normal.     Nose: Nose normal.  Eyes:     Extraocular Movements: Extraocular movements intact.     Pupils: Pupils are equal, round, and reactive to light.  Cardiovascular:     Rate and Rhythm: Normal rate and regular rhythm.     Pulses: Normal pulses.     Heart sounds: Normal heart sounds.  Pulmonary:     Effort: Pulmonary effort is normal.     Breath sounds: Normal breath sounds.  Abdominal:     General: Abdomen is flat. Bowel sounds are normal.  Musculoskeletal:        General: Normal range of motion.     Cervical back: Normal range of motion and neck supple.  Skin:    General: Skin is warm and dry.  Neurological:     Mental Status: She is alert and oriented to person, place, and time.  Psychiatric:        Mood and Affect: Mood normal.         Behavior: Behavior normal.        Thought Content: Thought content normal.        Judgment: Judgment normal.     Assessment & Plan:  Courtney Mendez was seen today for new patient (initial visit).  Diagnoses and all orders for this visit:  Encounter to establish care Juluis Mire, NP-C will be your  (PCP) she is mastered prepared . She is skilled to diagnosed and treat illness. Also able to answer health concern as well as continuing care of varied medical conditions, not limited by cause, organ system, or diagnosis.  -     CBC with Differential -     CMP14+EGFR  Pain of both shoulder joints      Pain bilateral shoulder and collar bone area.       Numbness and tingling left hand at times unable to pick up objects or grasp  -     Ambulatory referral to Orthopedic Surgery    Outpatient Encounter Medications as of 09/30/2019  Medication Sig  . methocarbamol (ROBAXIN) 500 MG tablet Take 1 tablet (500 mg total) by mouth 4 (four) times daily.  . [DISCONTINUED] acetaminophen (TYLENOL) 500 MG tablet Take 1 tablet (500 mg total) by mouth every 6 (six) hours as needed.  . [DISCONTINUED] benzonatate (TESSALON) 100 MG capsule Take 1 capsule (100 mg total) by mouth every 8 (eight) hours.  . [DISCONTINUED] lidocaine (XYLOCAINE) 2 % solution Use as directed 15 mLs in the mouth or throat as needed for mouth pain (swish and spit).  . [DISCONTINUED] methocarbamol (ROBAXIN) 500 MG tablet Take 1 tablet (500 mg total) by mouth 2 (two) times daily.  . [DISCONTINUED] metroNIDAZOLE (FLAGYL) 500 MG tablet Take 1 tablet (500 mg total) by mouth 2 (two) times daily.  . [DISCONTINUED] promethazine (PHENERGAN) 25 MG tablet Take 1 tablet (25 mg total) by mouth every 6 (six) hours as needed for nausea or vomiting.  . [DISCONTINUED] traMADol (ULTRAM) 50 MG tablet Take 1 tablet (50 mg total) by mouth every 6 (six) hours as needed.   No facility-administered encounter medications on file as of 09/30/2019.    Follow-up:  Return for schedule pap.   Kerin Perna, NP

## 2019-09-30 NOTE — Progress Notes (Signed)
Pt would like to have collar bone and/or sternum looked at because she has been having a lot of shoulder pain  Pt went to ED in oct 2020 she thought she pulled her rotator cuff in right shoulder but was told it was fine She has to do aromatherapy and epsom salt baths to relieve pain  She hears popping sounds in shoulders She has tingling and numbness on left arm  She has to use 4 pillows to prop herself up  She can only lay face down or on her back for comfort  States she has to use lidocaine patches for relief

## 2019-09-30 NOTE — Patient Instructions (Signed)
Shoulder Pain Many things can cause shoulder pain, including:  An injury.  Moving the shoulder in the same way again and again (overuse).  Joint pain (arthritis). Pain can come from:  Swelling and irritation (inflammation) of any part of the shoulder.  An injury to the shoulder joint.  An injury to: ? Tissues that connect muscle to bone (tendons). ? Tissues that connect bones to each other (ligaments). ? Bones. Follow these instructions at home: Watch for changes in your symptoms. Let your doctor know about them. Follow these instructions to help with your pain. If you have a sling:  Wear the sling as told by your doctor. Remove it only as told by your doctor.  Loosen the sling if your fingers: ? Tingle. ? Become numb. ? Turn cold and blue.  Keep the sling clean.  If the sling is not waterproof: ? Do not let it get wet. ? Take the sling off when you shower or bathe. Managing pain, stiffness, and swelling   If told, put ice on the painful area: ? Put ice in a plastic bag. ? Place a towel between your skin and the bag. ? Leave the ice on for 20 minutes, 2-3 times a day. Stop putting ice on if it does not help with the pain.  Squeeze a soft ball or a foam pad as much as possible. This prevents swelling in the shoulder. It also helps to strengthen the arm. General instructions  Take over-the-counter and prescription medicines only as told by your doctor.  Keep all follow-up visits as told by your doctor. This is important. Contact a doctor if:  Your pain gets worse.  Medicine does not help your pain.  You have new pain in your arm, hand, or fingers. Get help right away if:  Your arm, hand, or fingers: ? Tingle. ? Are numb. ? Are swollen. ? Are painful. ? Turn white or blue. Summary  Shoulder pain can be caused by many things. These include injury, moving the shoulder in the same away again and again, and joint pain.  Watch for changes in your symptoms.  Let your doctor know about them.  This condition may be treated with a sling, ice, and pain medicine.  Contact your doctor if the pain gets worse or you have new pain. Get help right away if your arm, hand, or fingers tingle or get numb, swollen, or painful.  Keep all follow-up visits as told by your doctor. This is important. This information is not intended to replace advice given to you by your health care provider. Make sure you discuss any questions you have with your health care provider. Document Revised: 07/03/2017 Document Reviewed: 07/03/2017 Elsevier Patient Education  2020 [employer]  

## 2019-10-01 ENCOUNTER — Ambulatory Visit: Payer: Self-pay

## 2019-10-01 ENCOUNTER — Ambulatory Visit (INDEPENDENT_AMBULATORY_CARE_PROVIDER_SITE_OTHER): Payer: Medicaid Other | Admitting: Orthopaedic Surgery

## 2019-10-01 ENCOUNTER — Encounter: Payer: Self-pay | Admitting: Orthopaedic Surgery

## 2019-10-01 VITALS — BP 107/76 | HR 55

## 2019-10-01 DIAGNOSIS — M25511 Pain in right shoulder: Secondary | ICD-10-CM | POA: Diagnosis not present

## 2019-10-01 DIAGNOSIS — M542 Cervicalgia: Secondary | ICD-10-CM | POA: Diagnosis not present

## 2019-10-01 DIAGNOSIS — M549 Dorsalgia, unspecified: Secondary | ICD-10-CM | POA: Diagnosis not present

## 2019-10-01 LAB — CBC WITH DIFFERENTIAL/PLATELET
Basophils Absolute: 0 10*3/uL (ref 0.0–0.2)
Basos: 1 %
EOS (ABSOLUTE): 0.1 10*3/uL (ref 0.0–0.4)
Eos: 1 %
Hematocrit: 39.5 % (ref 34.0–46.6)
Hemoglobin: 12.3 g/dL (ref 11.1–15.9)
Immature Grans (Abs): 0 10*3/uL (ref 0.0–0.1)
Immature Granulocytes: 0 %
Lymphocytes Absolute: 2.5 10*3/uL (ref 0.7–3.1)
Lymphs: 43 %
MCH: 25.2 pg — ABNORMAL LOW (ref 26.6–33.0)
MCHC: 31.1 g/dL — ABNORMAL LOW (ref 31.5–35.7)
MCV: 81 fL (ref 79–97)
Monocytes Absolute: 0.6 10*3/uL (ref 0.1–0.9)
Monocytes: 9 %
Neutrophils Absolute: 2.7 10*3/uL (ref 1.4–7.0)
Neutrophils: 46 %
Platelets: 320 10*3/uL (ref 150–450)
RBC: 4.88 x10E6/uL (ref 3.77–5.28)
RDW: 14.2 % (ref 11.7–15.4)
WBC: 5.9 10*3/uL (ref 3.4–10.8)

## 2019-10-01 LAB — CMP14+EGFR
ALT: 20 IU/L (ref 0–32)
AST: 21 IU/L (ref 0–40)
Albumin/Globulin Ratio: 1.9 (ref 1.2–2.2)
Albumin: 4.6 g/dL (ref 3.8–4.8)
Alkaline Phosphatase: 61 IU/L (ref 44–121)
BUN/Creatinine Ratio: 18 (ref 9–23)
BUN: 13 mg/dL (ref 6–20)
Bilirubin Total: 0.7 mg/dL (ref 0.0–1.2)
CO2: 21 mmol/L (ref 20–29)
Calcium: 9.5 mg/dL (ref 8.7–10.2)
Chloride: 101 mmol/L (ref 96–106)
Creatinine, Ser: 0.72 mg/dL (ref 0.57–1.00)
GFR calc Af Amer: 129 mL/min/{1.73_m2} (ref >59)
GFR calc non Af Amer: 112 mL/min/{1.73_m2} (ref >59)
Globulin, Total: 2.4 g/dL (ref 1.5–4.5)
Glucose: 75 mg/dL (ref 65–99)
Potassium: 4 mmol/L (ref 3.5–5.2)
Sodium: 141 mmol/L (ref 134–144)
Total Protein: 7 g/dL (ref 6.0–8.5)

## 2019-10-01 NOTE — Addendum Note (Signed)
Addended by: Barbette Or on: 10/01/2019 09:39 AM   Modules accepted: Orders

## 2019-10-01 NOTE — Progress Notes (Signed)
Office Visit Note   Patient: Courtney Mendez           Date of Birth: [birth year]           MRN: [id number] Visit Date: 10/01/2019              Requested by: Grayce Sessions, NP [address] [place],  [place] [address] PCP: Grayce Sessions, NP   Assessment & Plan: Visit Diagnoses:  1. Neck pain   2. Mid back pain   3. Right shoulder pain, unspecified chronicity     Plan: We will set patient up for some physical therapy for shoulder strengthening.  I plan to recheck patient in 6 weeks.  Follow-Up Instructions: Return in about 6 weeks (around 11/12/2019).   Orders:  Orders Placed This Encounter  Procedures  . XR Cervical Spine 2 or 3 views  . XR Thoracic Spine 2 View   No orders of the defined types were placed in this encounter.     Procedures: No procedures performed   Clinical Data: No additional findings.   Subjective: Chief Complaint  Patient presents with  . Neck - Pain  . Right Shoulder - Pain  . Left Shoulder - Pain    HPI is not 29 year old female seen with shoulder pain on the right side adjacent to the clavicle.  Patient had a history of potential shoulder subluxation October 2020 was placed in a sling and x-rays in the emergency room were negative for dislocation.  Patient is tried multiple medications used a lidocaine patch, muscle relaxants without relief.  Full range of motion is noted patient describes the pain as a achy type pain occasionally radiates across into the left shoulder inferior to the clavicle.  No numbness or tingling no gait disturbance no history of fever chills no history of cervical injury.  She has not been through any therapy.  Review of Systems all other systems are negative no history of rheumatologic conditions.   Objective: Vital Signs: BP 107/76   Pulse (!) 55   LMP 09/10/2019 (Exact Date)   Physical Exam Constitutional:      Appearance: She is well-developed.  HENT:     Head: Normocephalic.     Right  Ear: External ear normal.     Left Ear: External ear normal.  Eyes:     Pupils: Pupils are equal, round, and reactive to light.  Neck:     Thyroid: No thyromegaly.     Trachea: No tracheal deviation.  Cardiovascular:     Rate and Rhythm: Normal rate.  Pulmonary:     Effort: Pulmonary effort is normal.  Abdominal:     Palpations: Abdomen is soft.  Skin:    General: Skin is warm and dry.  Neurological:     Mental Status: She is alert and oriented to person, place, and time.  Psychiatric:        Behavior: Behavior normal.     Ortho Exam patient has 2+ upper extremity reflexes biceps triceps brachial radialis.  Normal pectoralis no shoulder subluxation negative apprehension with external rotation.  Some supraspinatus tenderness on the right negative drop arm test.  Biceps triceps wrist flexion extension supination pronation finger flexor extensors are all normal no atrophy.  Normal heel toe gait no clonus lower extremities.  Pectoralis strength is good shoulder external and internal rotation strength is strong.  Negative drop arm test. Specialty Comments:  No specialty comments available.  Imaging: No results found.   PMFS History: Patient Active  Problem List   Diagnosis Date Noted  . Pain in right shoulder 10/01/2019   Past Medical History:  Diagnosis Date  . 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 No pertinent family history.  History reviewed. No pertinent surgical history. Social History   Occupational History  . Not on file  Tobacco Use  . Smoking status: Current Every Day Smoker    Packs/day: 0.10    Types: Cigarettes  . Smokeless tobacco: Never Used  Substance and Sexual Activity  . Alcohol use: Yes    Comment: occasinally  . Drug use: Yes    Frequency: 3.0 times per week    Types: Marijuana  . Sexual activity: Yes    Birth control/protection: None

## 2019-10-20 ENCOUNTER — Encounter: Payer: Self-pay | Admitting: Physical Therapy

## 2019-10-20 ENCOUNTER — Other Ambulatory Visit: Payer: Self-pay

## 2019-10-20 ENCOUNTER — Ambulatory Visit: Payer: Medicaid Other | Attending: Orthopaedic Surgery | Admitting: Physical Therapy

## 2019-10-20 DIAGNOSIS — M25511 Pain in right shoulder: Secondary | ICD-10-CM | POA: Diagnosis not present

## 2019-10-20 DIAGNOSIS — G8929 Other chronic pain: Secondary | ICD-10-CM | POA: Insufficient documentation

## 2019-10-20 DIAGNOSIS — M25512 Pain in left shoulder: Secondary | ICD-10-CM | POA: Insufficient documentation

## 2019-10-20 DIAGNOSIS — M6281 Muscle weakness (generalized): Secondary | ICD-10-CM | POA: Diagnosis not present

## 2019-10-20 DIAGNOSIS — R293 Abnormal posture: Secondary | ICD-10-CM | POA: Diagnosis not present

## 2019-10-20 NOTE — Patient Instructions (Signed)
Access Code: [email or web address] URL: [email or web address] Date: 10/20/2019 Prepared by: Rosana Hoes  Exercises Banded Row - 1 x daily - 4-5 x weekly - 2-3 sets - 10 reps Shoulder extension with resistance - Neutral - 1 x daily - 4-5 x weekly - 2-3 sets - 10 reps Shoulder External Rotation and Scapular Retraction with Resistance - 1 x daily - 4-5 x weekly - 2-3 sets - 10 reps Doorway Pec Stretch at 60 Elevation - 2-3 reps - 20 seconds hold

## 2019-10-21 ENCOUNTER — Encounter: Payer: Self-pay | Admitting: Physical Therapy

## 2019-10-21 ENCOUNTER — Other Ambulatory Visit: Payer: Self-pay

## 2019-10-21 NOTE — Therapy (Addendum)
[place], [place], [address] Phone: [phone]   Fax:  [phone]  Physical Therapy Evaluation / Discharge   Patient Details  Name: Courtney Mendez MRN: [id number] Date of Birth: 1990/11/13 Referring Provider (PT): Marybelle Killings, MD   Encounter Date: 10/20/2019   PT End of Session - 10/21/19 0808    Visit Number 1    Number of Visits 8    Date for PT Re-Evaluation 12/15/19    Authorization Type [employer]    PT Start Time 7353    PT Stop Time 1530    PT Time Calculation (min) 45 min    Activity Tolerance Patient tolerated treatment well    Behavior During Therapy [facility] for tasks assessed/performed           Past Medical History:  Diagnosis Date  . 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 No pertinent surgical history.  There were no vitals filed for this visit.    Subjective Assessment - 10/20/19 1449    Subjective Patient reports about a year ago she injured right her shoulder where she either popped it out or strained a muscle. Ever since that injury her shoulder has been pretty sore and over the past few months she has been having more right and some left shoulder pain. Currently her shoulders feel like they get tense really fast and will start hurting when she does anything strenuous or when her body is exhausted. Her shoulders and arms also feel heavy until she does some type of water or epson salt soaks. She also notes that she is uncomfortable lying on her side at night. Patient also reports left hand will go numb with heavy lifting.    Limitations Sitting;House hold activities;Lifting    How long can you sit comfortably? No limitation    How long can you stand comfortably? No limitation    How long can you walk comfortably? No limitation    Diagnostic tests X-ray    Patient Stated Goals Get shoulders feeling better so she doesn't have to take  medications    Currently in Pain? Yes    Pain Score 0-No pain    Pain Location Shoulder    Pain Orientation Right;Left    Pain Descriptors / Indicators Sore;Tiring;Heaviness;Aching    Pain Type Chronic pain    Pain Radiating Towards chest and axillary regions    Pain Onset Today    Pain Frequency Intermittent    Aggravating Factors  Strenuous activity    Pain Relieving Factors Muscle relaxer, warm water muscle soak, epson salt    Effect of Pain on Daily Activities Patient reports she is limited with activity due to shoulder pain              [facility] PT Assessment - 10/21/19 0001      Assessment   Medical Diagnosis Right shoulder pain    Referring Provider (PT) Marybelle Killings, MD    Onset Date/Surgical Date 10/26/18    Hand Dominance Right    Next MD Visit 11/12/2019    Prior Therapy None      Precautions   Precautions None      Restrictions   Weight Bearing Restrictions No      Balance Screen   Has the patient fallen in the past 6 months No    Has the patient had a decrease in activity level because of  a fear of falling?  No    Is the patient reluctant to leave their home because of a fear of falling?  No      Prior Function   Level of Independence Independent    Vocation Full time employment    Vocation Requirements [employer], standing majority of time    Leisure None reported      Cognition   Overall Cognitive Status Within Functional Limits for tasks assessed      Observation/Other Assessments   Observations Patient appears in no apparent distress    Focus on Therapeutic Outcomes (FOTO)  NA - MCD      Sensation   Light Touch Appears Intact      Coordination   Gross Motor Movements are Fluid and Coordinated Yes      Posture/Postural Control   Posture Comments Patient exhibits rounded shoulder posture      ROM / Strength   AROM / PROM / Strength AROM;PROM;Strength      AROM   Overall AROM Comments Patient exhibits bilateral shoulder AROM grossly WFL  and equal bilaterally but with pain and tightness toward end ranges of motion in all directions      PROM   Overall PROM Comments Shoulder PROM grossly WFL bilaterally, subacromial pain at end range with over pressure in all directions      Strength   Overall Strength Comments Periscapular strength grossly 4-/5 MMT with upper trap dominance    Strength Assessment Site Shoulder    Right/Left Shoulder Right;Left    Right Shoulder Flexion 4/5    Right Shoulder ABduction 4/5    Right Shoulder Internal Rotation 4/5    Right Shoulder External Rotation 4/5    Left Shoulder Flexion 4/5    Left Shoulder ABduction 4/5    Left Shoulder Internal Rotation 4/5    Left Shoulder External Rotation 4/5      Palpation   Palpation comment Non-TTP      Special Tests    Special Tests Rotator Cuff Impingement;Cervical    Cervical Tests Spurling's    Rotator Cuff Impingment tests Michel Bickers test      Spurling's   Findings Negative      Hawkins-Kennedy test   Findings Positive    Side Right      Transfers   Transfers Independent with all Transfers                      Objective measurements completed on examination: See above findings.       [facility] Adult PT Treatment/Exercise - 10/21/19 0001      Exercises   Exercises Shoulder      Shoulder Exercises: Seated   External Rotation 10 reps    Theraband Level (Shoulder External Rotation) Level 2 (Red)    External Rotation Limitations double ER + scap retraction      Shoulder Exercises: Standing   Extension 10 reps    Theraband Level (Shoulder Extension) Level 2 (Red)    Row 10 reps    Theraband Level (Shoulder Row) Level 2 (Red)                  PT Education - 10/21/19 0807    Education Details Exam findings, POC, HEP, posture    Person(s) Educated Patient    Methods Explanation;Demonstration;Tactile cues;Verbal cues;Handout    Comprehension Verbalized understanding;Returned demonstration;Verbal cues  required;Tactile cues required;Need further instruction  PT Short Term Goals - 10/21/19 0808      PT SHORT TERM GOAL #1   Title Patient will be I with initial HEP to progress with PT    Baseline HEP provided at evaluation    Time 4    Period Weeks    Status New    Target Date 11/17/19      PT SHORT TERM GOAL #2   Title Patient will report no pain or tightness with shoulder AROM bilaterally    Baseline Patient notes discomfort and tightness with shoulder AROM    Time 4    Period Weeks    Status New    Target Date 11/17/19      PT SHORT TERM GOAL #3   Title Patient will demonstrate proper posture without cueing to reduce muscular tension and pain    Baseline Patient exhibits rounded shoulder posture    Time 4    Period Weeks    Status New    Target Date 11/17/19             PT Long Term Goals - 10/21/19 0809      PT LONG TERM GOAL #1   Title Patient will be I with final HEP to maintain progress from PT    Baseline HEP provided at evaluation    Time 8    Period Weeks    Status New    Target Date 12/15/19      PT LONG TERM GOAL #2   Title Patient will exhibit gross bilateral shoulder and periscapular strength grossly >/= 4+/5 MMT in order to improve postural control and tolerance for activity    Baseline Patient exhibit grosly 4/5 MMT shoulder strenght and 4-/5 MMT periscapular strength    Time 8    Period Weeks    Status New    Target Date 12/15/19      PT LONG TERM GOAL #3   Title Patient will be able to tolerate full day of work without need for muscle soak or medication    Baseline Patient reports she has to take muscle relaxer or do a muscle soak after work or activity    Time 8    Period Weeks    Status New    Target Date 12/16/19                  Plan - 10/21/19 8828    Clinical Impression Statement Patient presents to PT with report of bilateral shoulder pain and postural challenges. She currently demonstrates shoulder AROM  grossly WFL but reports discomfort towards end ranges, gross shoulder and periscapular weakness, and postural deficits. Her pain seems muscular in nature and likely related to poor postural control leading to shoulder/chest discomfort and possible subacromial pain. She was provided exercises to initiate periscapular strengthening and patient would benefit from continued skilled PT to progress her postural control and reduce pain with activity.    Personal Factors and Comorbidities Fitness;Time since onset of injury/illness/exacerbation    Examination-Activity Limitations Lift;Reach Overhead;Sleep    Examination-Participation Restrictions Occupation    Stability/Clinical Decision Making Stable/Uncomplicated    Clinical Decision Making Low    Rehab Potential Good    PT Frequency 1x / week    PT Duration 8 weeks    PT Treatment/Interventions ADLs/Self Care Home Management;Cryotherapy;Electrical Stimulation;Iontophoresis 38m/ml Dexamethasone;Moist Heat;Therapeutic exercise;Therapeutic activities;Patient/family education;Manual techniques;Dry needling;Passive range of motion;Taping;Joint Manipulations;Spinal Manipulations    PT Next Visit Plan Review HEP and progress PRN, postural strengthening with pec stretching  and periscapular strengthening, incorporate thoracic mobility, manual PRN    PT Home Exercise Plan [email or web address]: row, extension, double ER with red    Consulted and Agree with Plan of Care Patient           Patient will benefit from skilled therapeutic intervention in order to improve the following deficits and impairments:  Postural dysfunction, Decreased strength, Pain, Decreased activity tolerance   Check all possible CPT codes:      _0  97110 (Therapeutic Exercise)  _1  92507 (SLP Treatment)  _2  42395 (Neuro Re-ed)   _3  92526 (Swallowing Treatment)   _4  32023 (Gait Training)   _5  34356 (Cognitive Training, 1st 15 minutes) _6  97140 (Manual Therapy)   _7  97130 (Cognitive Training, each  add'l 15 minutes)  _8  97530 (Therapeutic Activities)  _9  Other, List CPT Code ____________    _10  86168 (Self Care)       _11  All codes above (97110 - 97535)  _12  97012 (Mechanical Traction)  _13  97014 (E-stim Unattended)  _14  97032 (E-stim manual)  _15  97033 (Ionto)  _16  97035 (Ultrasound)  _17  97016 (Vaso)  _18  97760 (Orthotic Fit) _19  [id number] (Prosthetic Training) _20  [id number] (Physical Performance Training) _21  [id number] (Aquatic Therapy) _22  37290 (Canalith Repositioning) _23  21115 (Contrast Bath) _24  52080 (Paraffin) _25  97597 (Wound Care 1st 20 sq cm) _26  97598 (Wound Care each add'l 20 sq cm)     Visit Diagnosis: Chronic right shoulder pain  Chronic left shoulder pain  Muscle weakness (generalized)  Abnormal posture     Problem List Patient Active Problem List   Diagnosis Date Noted  . Pain in right shoulder 10/01/2019    Hilda Blades, PT, DPT, LAT, ATC 10/21/19  9:46 AM Phone: [phone] Fax: [facility] [address] [place], [place], [address] Phone: [phone]   Fax:  [phone]  Name: Courtney Mendez MRN: [id number] Date of Birth: September 19, 1990   PHYSICAL THERAPY DISCHARGE SUMMARY  Visits from Start of Care: 1  Current functional level related to goals / functional outcomes: See above   Remaining deficits: See above   Education / Equipment: HEP Plan:                                                    Patient goals were not met. Patient is being discharged due to not returning since the last visit.  ?????    Hilda Blades, PT, DPT, LAT, ATC 12/16/19  10:18 AM Phone: [phone] Fax: [phone]

## 2019-10-31 ENCOUNTER — Ambulatory Visit: Payer: Medicaid Other

## 2019-10-31 ENCOUNTER — Telehealth: Payer: Self-pay

## 2019-10-31 NOTE — Telephone Encounter (Signed)
Spoke with pt who said, "I thought it was for Friday", not realizing today is Friday. Confirmed next appt with pt for next Friday, 11/5 at 9:15am, and pt verbalized she could make it.   Bettey Mare. Corliss Marcus, PT, DPT

## 2019-11-07 ENCOUNTER — Ambulatory Visit: Payer: Medicaid Other | Attending: Orthopaedic Surgery | Admitting: Physical Therapy

## 2019-11-07 ENCOUNTER — Telehealth: Payer: Self-pay | Admitting: Physical Therapy

## 2019-11-07 ENCOUNTER — Encounter: Payer: Medicaid Other | Admitting: Physical Therapy

## 2019-11-07 NOTE — Telephone Encounter (Signed)
Attempted to contact patient regarding missed PT appointment. Left VM informing patient of missed appointment and this was her 2nd consecutive no-show, so one more missed appointment would result in discharge from PT due to attendance policy. Patient was reminded of her next PT appointment on 11/14/2019 at 12:15pm.   Rosana Hoes, PT, DPT, LAT, ATC 11/07/19  11:10 AM Phone: [phone] Fax: [phone]

## 2019-11-12 ENCOUNTER — Ambulatory Visit: Payer: Medicaid Other | Admitting: Orthopaedic Surgery

## 2019-11-14 ENCOUNTER — Ambulatory Visit: Payer: Medicaid Other | Admitting: Physical Therapy

## 2019-11-20 IMAGING — CR DG SHOULDER 2+V*R*
3 series · 3 of 3 positions shown · non-contrast
Comparison: None.

CLINICAL DATA: Pain after moving heavy object

EXAM:
RIGHT SHOULDER - 2+ VIEW

[w shoulder external right]
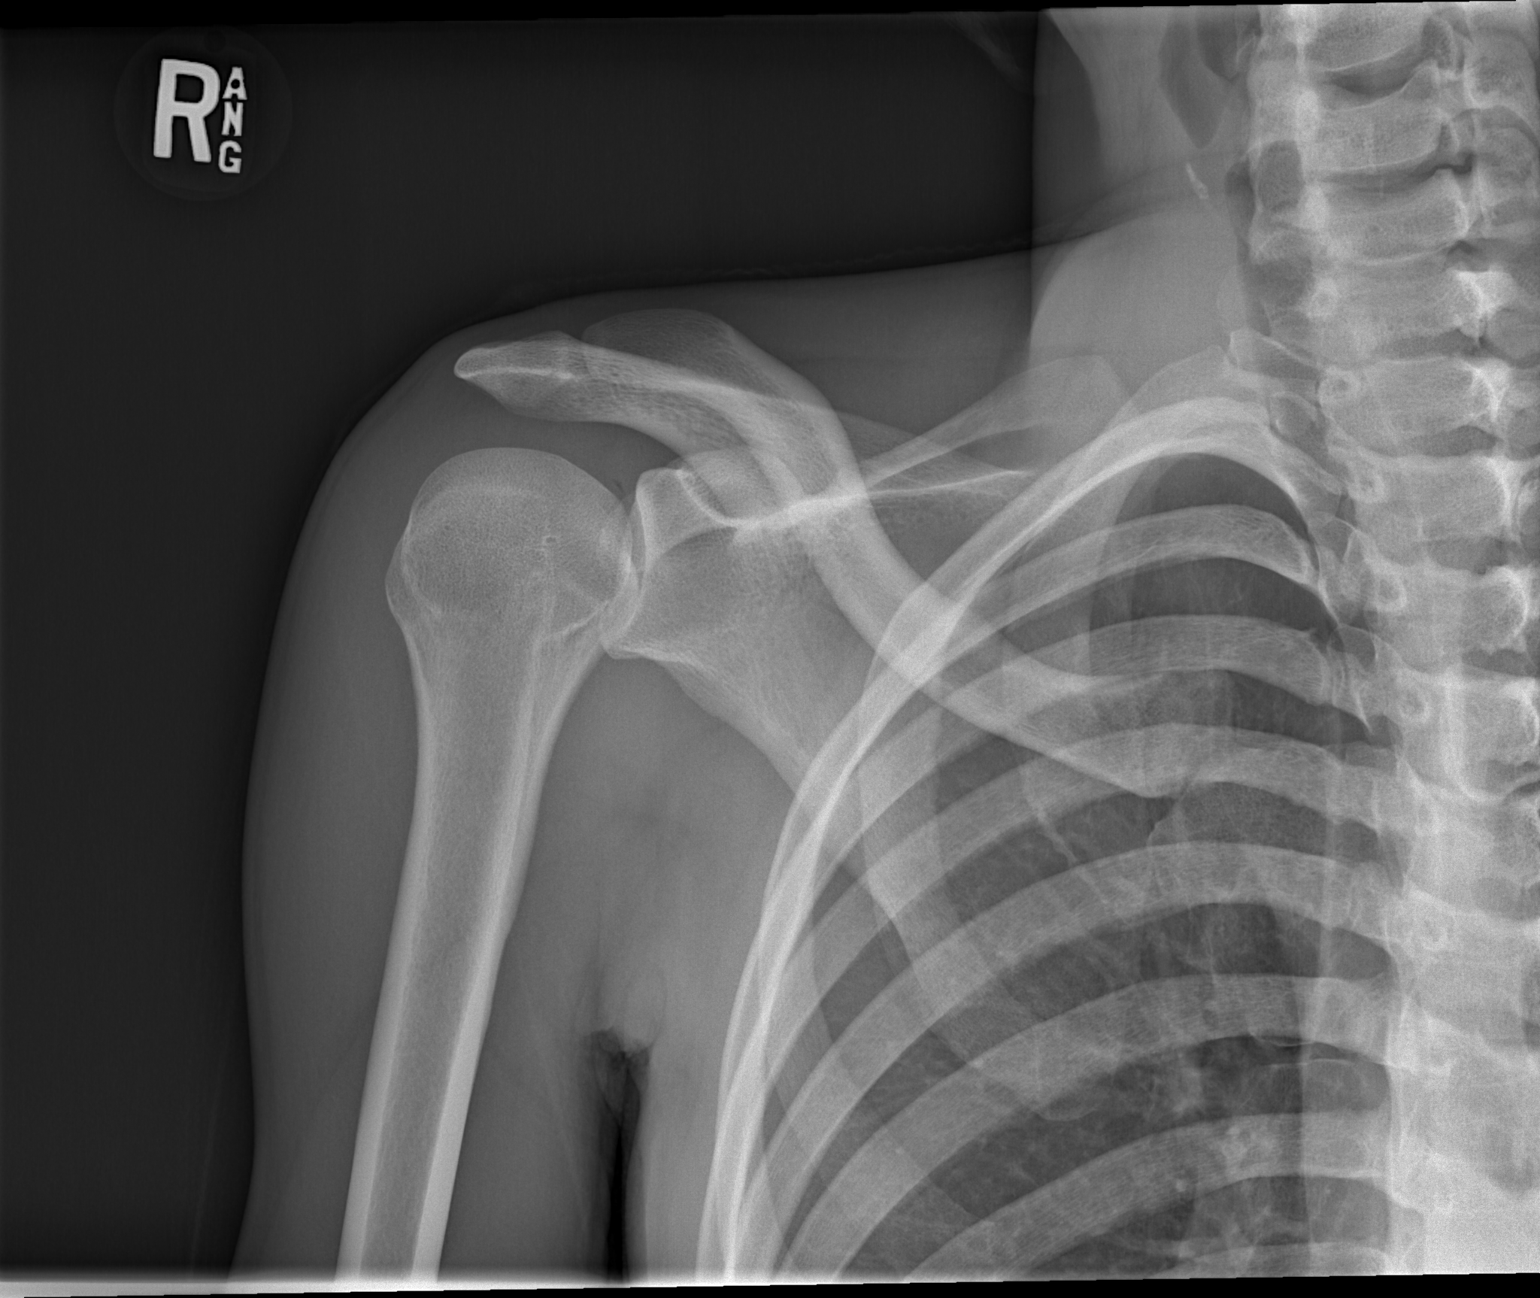

[w shoulder y-view right]
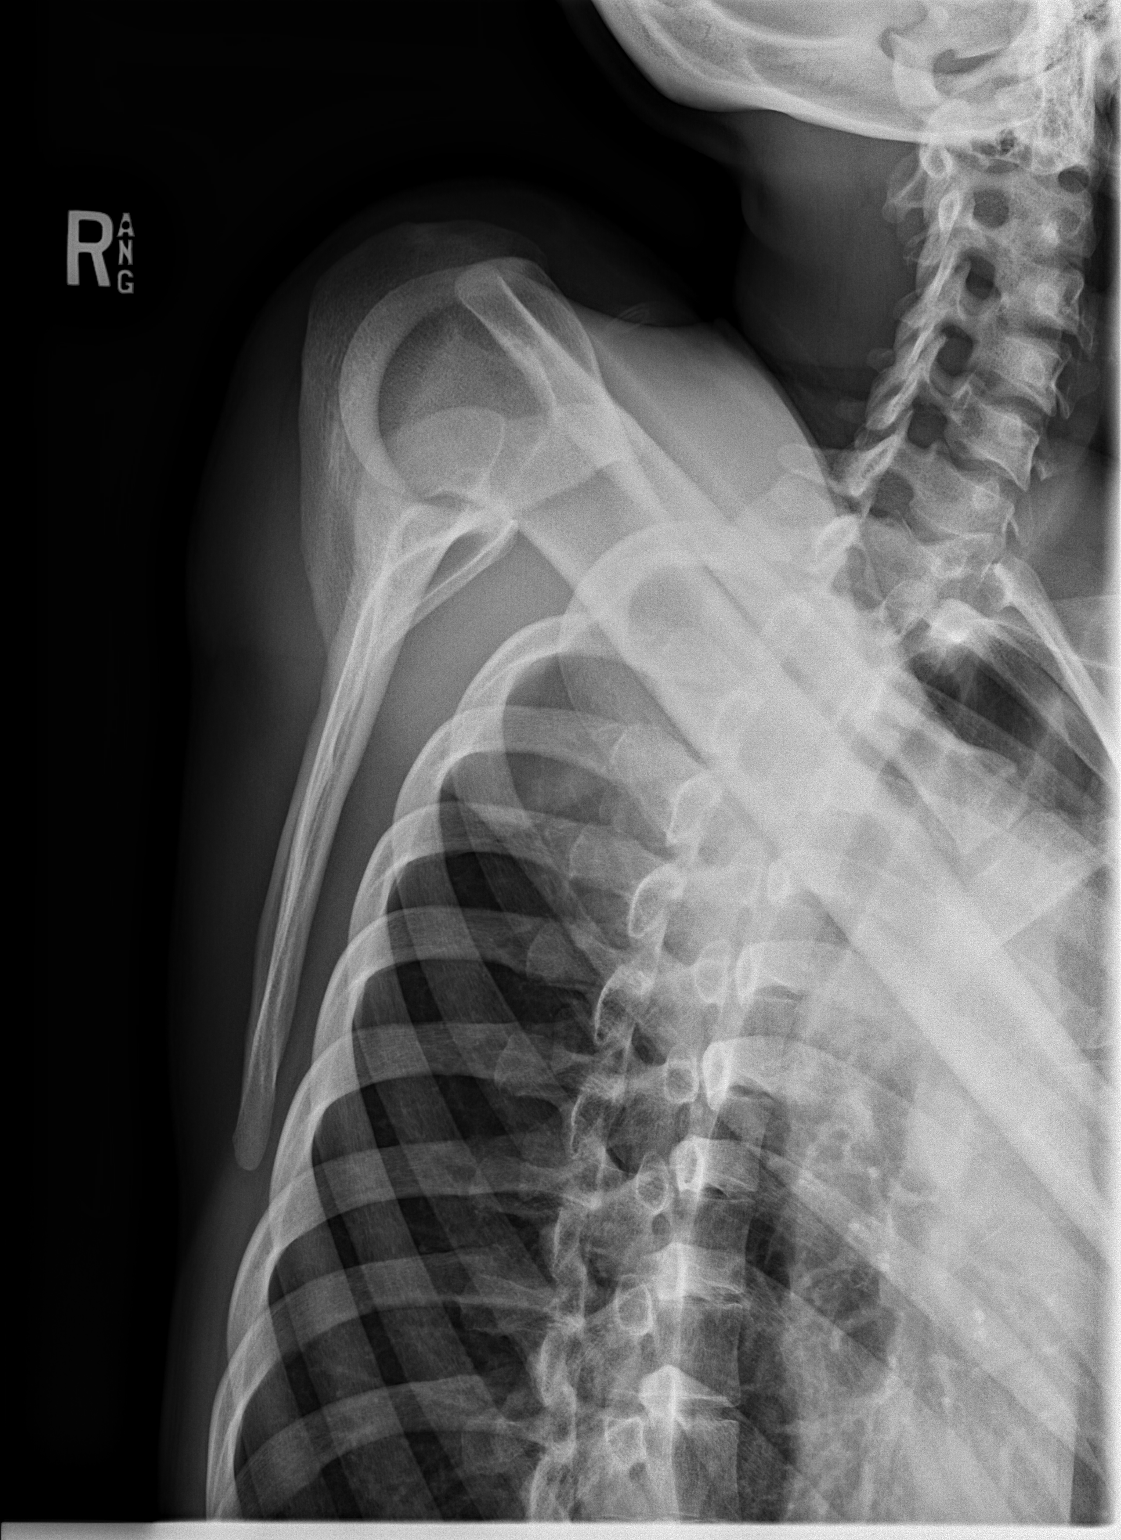

[x shoulder axillary right]
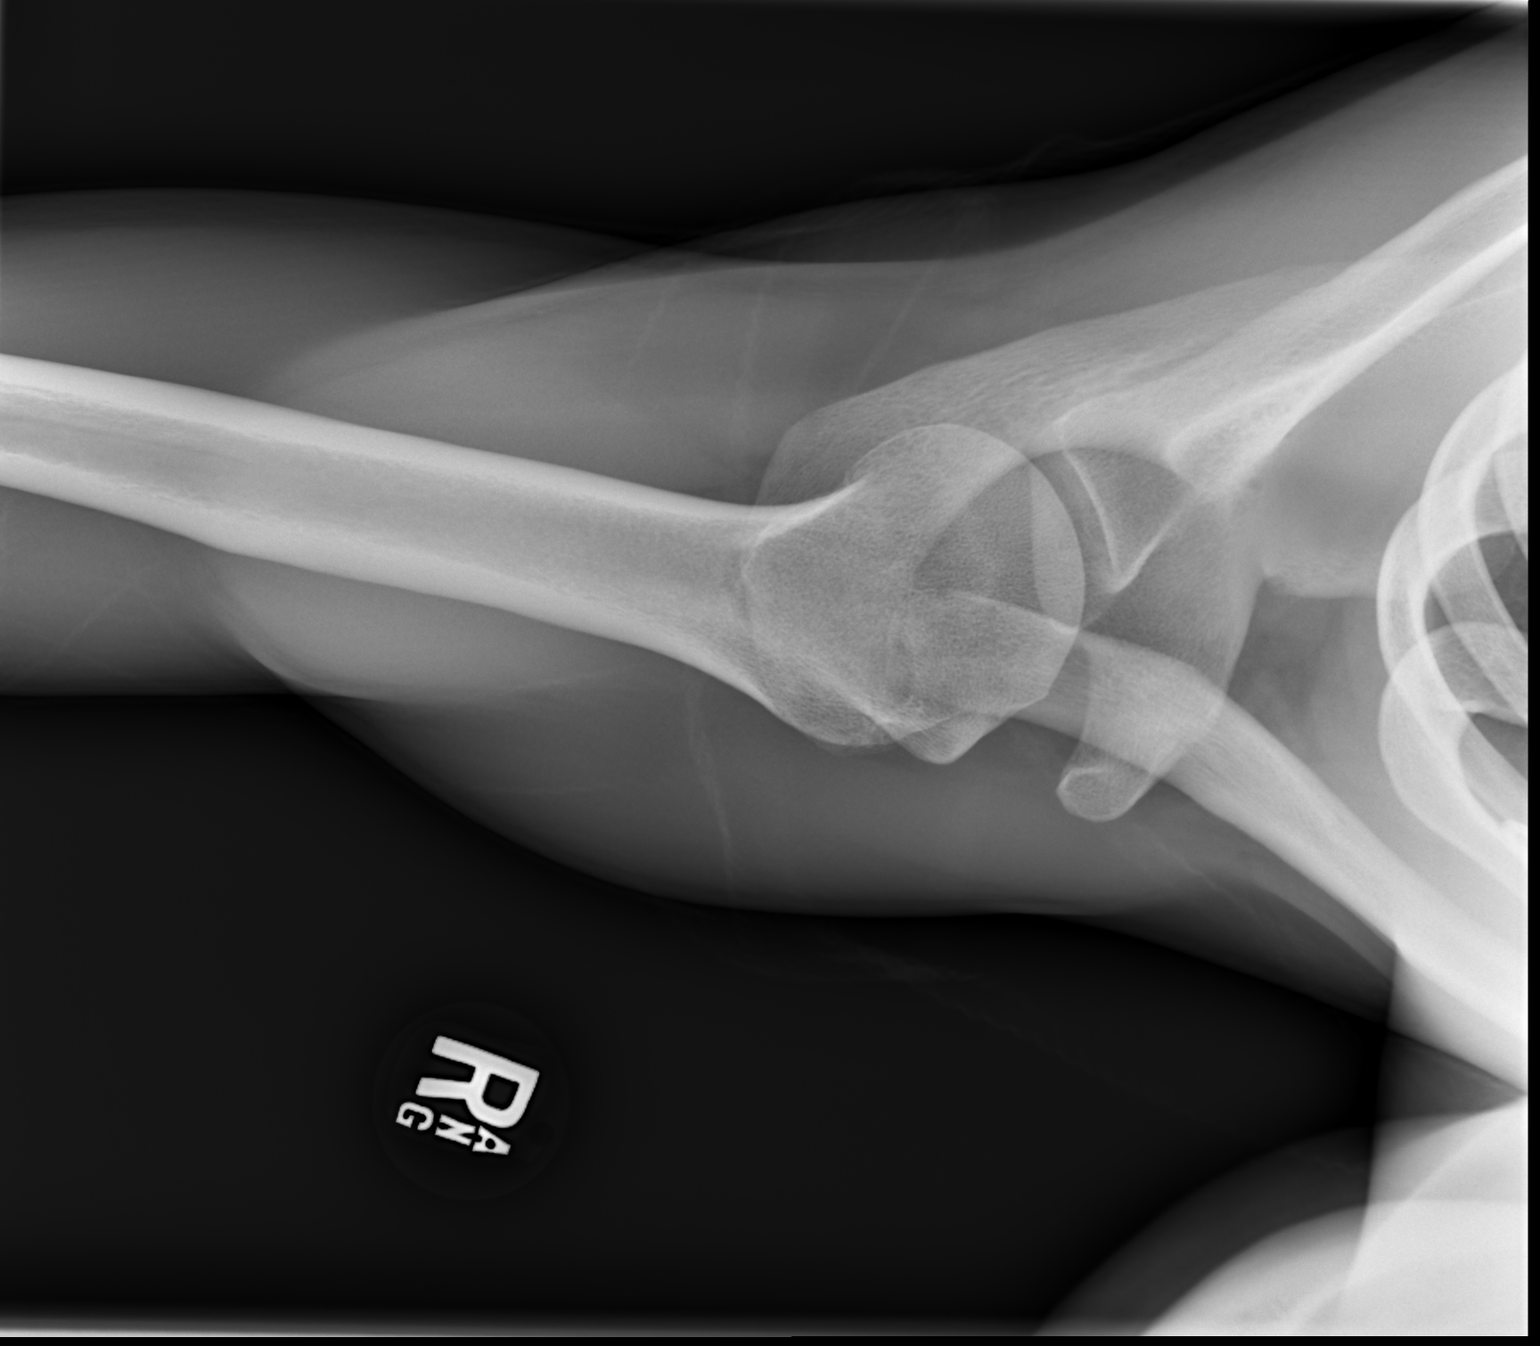

[3 of 3 positions shown; findings below may reference images not displayed]

FINDINGS: Oblique, Y scapular, and axillary images were obtained. No
appreciable fracture or dislocation. Joint spaces appear
unremarkable. No erosive change. Visualized right lung clear.
IMPRESSION: No evident fracture or dislocation.  No appreciable arthropathy.

## 2019-11-21 ENCOUNTER — Ambulatory Visit: Payer: Medicaid Other | Admitting: Physical Therapy

## 2019-12-05 ENCOUNTER — Ambulatory Visit: Payer: Medicaid Other | Attending: Orthopaedic Surgery | Admitting: Physical Therapy

## 2020-02-16 DIAGNOSIS — R079 Chest pain, unspecified: Secondary | ICD-10-CM | POA: Diagnosis not present

## 2020-02-25 ENCOUNTER — Encounter (INDEPENDENT_AMBULATORY_CARE_PROVIDER_SITE_OTHER): Payer: Medicaid Other | Admitting: Primary Care

## 2020-03-27 ENCOUNTER — Ambulatory Visit
Admission: RE | Admit: 2020-03-27 | Discharge: 2020-03-27 | Disposition: A | Discharge status: Home / Self Care | Payer: Medicaid Other | Source: Ambulatory Visit | Attending: Internal Medicine | Admitting: Internal Medicine

## 2020-03-27 ENCOUNTER — Other Ambulatory Visit: Payer: Self-pay

## 2020-03-27 VITALS — BP 105/71 | HR 64 | Temp 98.6°F | Resp 16

## 2020-03-27 DIAGNOSIS — M7918 Myalgia, other site: Secondary | ICD-10-CM | POA: Diagnosis not present

## 2020-03-27 MED ORDER — CYCLOBENZAPRINE HCL 10 MG PO TABS: 10.0000 mg | ORAL_TABLET | Freq: Two times a day (BID) | ORAL | 0 refills | Status: AC | PRN

## 2020-03-27 MED ORDER — IBUPROFEN 600 MG PO TABS: 600.0000 mg | ORAL_TABLET | Freq: Four times a day (QID) | ORAL | 0 refills | Status: DC | PRN

## 2020-03-27 NOTE — Discharge Instructions (Signed)
Gentle range of motion exercises Heating pad will be helpful using the 10-minute on- 10 minutes off cycle. If you have worsening symptoms please return to the urgent care to be reevaluated.

## 2020-03-27 NOTE — ED Triage Notes (Signed)
Pt said she has been having neck pain that is in the back of head and runs down the left side of her back. Hard to sleep and move. Pt said making her have a headache.On muscle relaxer's and tramadol and nothing working
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
## 2020-09-09 ENCOUNTER — Telehealth: Payer: Medicaid Other | Admitting: Physician Assistant

## 2020-09-09 DIAGNOSIS — R112 Nausea with vomiting, unspecified: Secondary | ICD-10-CM

## 2020-09-09 MED ORDER — ONDANSETRON 4 MG PO TBDP: 4.0000 mg | ORAL_TABLET | Freq: Three times a day (TID) | ORAL | 0 refills | Status: DC | PRN

## 2020-09-09 NOTE — Progress Notes (Signed)
I have spent 5 minutes in review of e-visit questionnaire, review and updating patient chart, medical decision making and response to patient.   Keiland Pickering Cody Latisa Belay, PA-C    

## 2020-09-09 NOTE — Progress Notes (Signed)
E-Visit for Vomiting  We are sorry that you are not feeling well. Here is how we plan to help!  I have prescribed a medication that will help alleviate your symptoms and allow you to stay hydrated:  Zofran 4 mg 1 tablet every 8 hours as needed for nausea and vomiting  HOME CARE: Drink clear liquids.  This is very important! Dehydration (the lack of fluid) can lead to a serious complication.  Start off with 1 tablespoon every 5 minutes for 8 hours. You may begin eating bland foods after 8 hours without vomiting.  Start with saltine crackers, white bread, rice, mashed potatoes, applesauce. After 48 hours on a bland diet, you may resume a normal diet. Try to go to sleep.  Sleep often empties the stomach and relieves the need to vomit.  GET HELP RIGHT AWAY IF:  Your symptoms do not improve or worsen within 2 days after treatment. You have a fever for over 3 days. You cannot keep down fluids after trying the medication.  MAKE SURE YOU:  Understand these instructions. Will watch your condition. Will get help right away if you are not doing well or get worse.   Thank you for choosing an e-visit.  Your e-visit answers were reviewed by a board certified advanced clinical practitioner to complete your personal care plan. Depending upon the condition, your plan could have included both over the counter or prescription medications.  Please review your pharmacy choice. Make sure the pharmacy is open so you can pick up prescription now. If there is a problem, you may contact your provider through [employer] and have the prescription routed to another pharmacy.  Your safety is important to [place]. If you have drug allergies check your prescription carefully.   For the next 24 hours you can use MyChart to ask questions about today's visit, request a non-urgent call back, or ask for a work or school excuse. You will get an email in the next two days asking about your experience. I hope that your  e-visit has been valuable and will speed your recovery.

## 2020-10-19 ENCOUNTER — Ambulatory Visit (INDEPENDENT_AMBULATORY_CARE_PROVIDER_SITE_OTHER): Payer: Medicaid Other | Admitting: Primary Care

## 2020-11-02 ENCOUNTER — Telehealth: Payer: Medicaid Other | Admitting: Physician Assistant

## 2020-11-02 DIAGNOSIS — J069 Acute upper respiratory infection, unspecified: Secondary | ICD-10-CM | POA: Diagnosis not present

## 2020-11-02 MED ORDER — FLUTICASONE PROPIONATE 50 MCG/ACT NA SUSP: 2.0000 | Freq: Every day | NASAL | 0 refills | Status: DC

## 2020-11-02 MED ORDER — BENZONATATE 100 MG PO CAPS: 100.0000 mg | ORAL_CAPSULE | Freq: Three times a day (TID) | ORAL | 0 refills | Status: DC | PRN

## 2020-11-02 NOTE — Progress Notes (Signed)
I have spent 5 minutes in review of e-visit questionnaire, review and updating patient chart, medical decision making and response to patient.   Aviela Blundell Cody Donna Snooks, PA-C    

## 2020-11-02 NOTE — Progress Notes (Signed)

## 2020-11-03 ENCOUNTER — Telehealth: Payer: Medicaid Other | Admitting: Physician Assistant

## 2020-11-03 DIAGNOSIS — R6889 Other general symptoms and signs: Secondary | ICD-10-CM

## 2020-11-03 NOTE — Progress Notes (Signed)
E visit for Flu like symptoms   We are sorry that you are not feeling well.  Here is how we plan to help! Based on what you have shared with me it looks like you may have flu-like symptoms that should be watched but do not seem to indicate anti-viral treatment.  Influenza or "the flu" is   an infection caused by a respiratory virus. The flu virus is highly contagious and persons who did not receive their yearly flu vaccination may "catch" the flu from close contact.  We have anti-viral medications to treat the viruses that cause this infection. They are not a "cure" and only shorten the course of the infection. These prescriptions are most effective when they are given within the first 2 days of "flu" symptoms. Antiviral medication are indicated if you have a high risk of complications from the flu. You should  also consider an antiviral medication if you are in close contact with someone who is at risk. These medications can help patients avoid complications from the flu  but have side effects that you should know. Possible side effects from Tamiflu or oseltamivir include nausea, vomiting, diarrhea, dizziness, headaches, eye redness, sleep problems or other respiratory symptoms. You should not take Tamiflu if you have an allergy to oseltamivir or any to the ingredients in Tamiflu.  Based upon your symptoms and potential risk factors I recommend that you follow the flu symptoms recommendation that I have listed below.  ANYONE WHO HAS FLU SYMPTOMS SHOULD: Stay home. The flu is highly contagious and going out or to work exposes others! Be sure to drink plenty of fluids. Water is fine as well as fruit juices, sodas and electrolyte beverages. You may want to stay away from caffeine or alcohol. If you are nauseated, try taking small sips of liquids. How do you know if you are getting enough fluid? Your urine should be a pale yellow or almost colorless. Get rest. Taking a steamy shower or using a  humidifier may help nasal congestion and ease sore throat pain. Using a saline nasal spray works much the same way. Cough drops, hard candies and sore throat lozenges may ease your cough. Line up a caregiver. Have someone check on you regularly.   GET HELP RIGHT AWAY IF: You cannot keep down liquids or your medications. You become short of breath Your fell like you are going to pass out or loose consciousness. Your symptoms persist after you have completed your treatment plan MAKE SURE YOU  Understand these instructions. Will watch your condition. Will get help right away if you are not doing well or get worse.  Your e-visit answers were reviewed by a board certified advanced clinical practitioner to complete your personal care plan.  Depending on the condition, your plan could have included both over the counter or prescription medications.  If there is a problem please reply  once you have received a response from your provider.  Your safety is important to us.  If you have drug allergies check your prescription carefully.    You can use MyChart to ask questions about today's visit, request a non-urgent call back, or ask for a work or school excuse for 24 hours related to this e-Visit. If it has been greater than 24 hours you will need to follow up with your provider, or enter a new e-Visit to address those concerns.  You will get an e-mail in the next two days asking about your experience.  I hope that   your e-visit has been valuable and will speed your recovery. Thank you for using e-visits.  I provided 5 minutes of non face-to-face time during this encounter for chart review and documentation.   

## 2021-04-05 ENCOUNTER — Encounter (INDEPENDENT_AMBULATORY_CARE_PROVIDER_SITE_OTHER): Payer: Medicaid Other | Admitting: Primary Care

## 2021-04-14 ENCOUNTER — Encounter (INDEPENDENT_AMBULATORY_CARE_PROVIDER_SITE_OTHER): Payer: Medicaid Other | Admitting: Primary Care

## 2021-08-27 ENCOUNTER — Emergency Department (HOSPITAL_COMMUNITY): Payer: Medicaid Other | Admitting: Certified Registered Nurse Anesthetist

## 2021-08-27 ENCOUNTER — Emergency Department (HOSPITAL_COMMUNITY): Payer: Medicaid Other

## 2021-08-27 ENCOUNTER — Other Ambulatory Visit: Payer: Self-pay

## 2021-08-27 ENCOUNTER — Inpatient Hospital Stay (HOSPITAL_COMMUNITY)
Admission: EM | Admit: 2021-08-27 | Discharge: 2021-08-30 | DRG: 460 | Disposition: A | Discharge status: Home / Self Care | Payer: Medicaid Other | Attending: Neurosurgery | Admitting: Neurosurgery

## 2021-08-27 ENCOUNTER — Encounter (HOSPITAL_COMMUNITY)
Admission: EM | Disposition: A | Discharge status: Home / Self Care | Payer: Self-pay | Source: Home / Self Care | Attending: Neurosurgery

## 2021-08-27 ENCOUNTER — Encounter (HOSPITAL_COMMUNITY): Payer: Self-pay | Admitting: Certified Registered Nurse Anesthetist

## 2021-08-27 DIAGNOSIS — S0081XA Abrasion of other part of head, initial encounter: Secondary | ICD-10-CM | POA: Diagnosis present

## 2021-08-27 DIAGNOSIS — Z91014 Allergy to mammalian meats: Secondary | ICD-10-CM | POA: Diagnosis not present

## 2021-08-27 DIAGNOSIS — S32028A Other fracture of second lumbar vertebra, initial encounter for closed fracture: Secondary | ICD-10-CM

## 2021-08-27 DIAGNOSIS — F17210 Nicotine dependence, cigarettes, uncomplicated: Secondary | ICD-10-CM | POA: Diagnosis present

## 2021-08-27 DIAGNOSIS — Z4889 Encounter for other specified surgical aftercare: Secondary | ICD-10-CM

## 2021-08-27 DIAGNOSIS — S32010A Wedge compression fracture of first lumbar vertebra, initial encounter for closed fracture: Secondary | ICD-10-CM | POA: Diagnosis not present

## 2021-08-27 DIAGNOSIS — S32022A Unstable burst fracture of second lumbar vertebra, initial encounter for closed fracture: Secondary | ICD-10-CM

## 2021-08-27 DIAGNOSIS — M545 Low back pain, unspecified: Secondary | ICD-10-CM | POA: Diagnosis not present

## 2021-08-27 DIAGNOSIS — W19XXXA Unspecified fall, initial encounter: Secondary | ICD-10-CM | POA: Diagnosis not present

## 2021-08-27 DIAGNOSIS — W130XXA Fall from, out of or through balcony, initial encounter: Secondary | ICD-10-CM | POA: Diagnosis present

## 2021-08-27 DIAGNOSIS — R4182 Altered mental status, unspecified: Secondary | ICD-10-CM | POA: Diagnosis not present

## 2021-08-27 DIAGNOSIS — S3991XA Unspecified injury of abdomen, initial encounter: Secondary | ICD-10-CM | POA: Diagnosis not present

## 2021-08-27 DIAGNOSIS — S299XXA Unspecified injury of thorax, initial encounter: Secondary | ICD-10-CM | POA: Diagnosis not present

## 2021-08-27 DIAGNOSIS — S12100A Unspecified displaced fracture of second cervical vertebra, initial encounter for closed fracture: Secondary | ICD-10-CM | POA: Diagnosis not present

## 2021-08-27 DIAGNOSIS — R270 Ataxia, unspecified: Secondary | ICD-10-CM | POA: Diagnosis not present

## 2021-08-27 DIAGNOSIS — S32029A Unspecified fracture of second lumbar vertebra, initial encounter for closed fracture: Secondary | ICD-10-CM | POA: Diagnosis not present

## 2021-08-27 DIAGNOSIS — S0990XA Unspecified injury of head, initial encounter: Secondary | ICD-10-CM | POA: Diagnosis not present

## 2021-08-27 DIAGNOSIS — R9431 Abnormal electrocardiogram [ECG] [EKG]: Secondary | ICD-10-CM | POA: Diagnosis not present

## 2021-08-27 LAB — TYPE AND SCREEN
ABO/RH(D): O POS
Antibody Screen: NEGATIVE

## 2021-08-27 LAB — CBC WITH DIFFERENTIAL/PLATELET
Abs Immature Granulocytes: 0.03 10*3/uL (ref 0.00–0.07)
Basophils Absolute: 0 10*3/uL (ref 0.0–0.1)
Basophils Relative: 1 %
Eosinophils Absolute: 0.1 10*3/uL (ref 0.0–0.5)
Eosinophils Relative: 1 %
HCT: 41.1 % (ref 36.0–46.0)
Hemoglobin: 13 g/dL (ref 12.0–15.0)
Immature Granulocytes: 0 %
Lymphocytes Relative: 31 %
Lymphs Abs: 2.2 10*3/uL (ref 0.7–4.0)
MCH: 25.6 pg — ABNORMAL LOW (ref 26.0–34.0)
MCHC: 31.6 g/dL (ref 30.0–36.0)
MCV: 80.9 fL (ref 80.0–100.0)
Monocytes Absolute: 0.4 10*3/uL (ref 0.1–1.0)
Monocytes Relative: 5 %
Neutro Abs: 4.2 10*3/uL (ref 1.7–7.7)
Neutrophils Relative %: 62 %
Platelets: 351 10*3/uL (ref 150–400)
RBC: 5.08 MIL/uL (ref 3.87–5.11)
RDW: 13.7 % (ref 11.5–15.5)
WBC: 6.9 10*3/uL (ref 4.0–10.5)
nRBC: 0 % (ref 0.0–0.2)

## 2021-08-27 LAB — BASIC METABOLIC PANEL
Anion gap: 11 (ref 5–15)
BUN: 5 mg/dL — ABNORMAL LOW (ref 6–20)
CO2: 23 mmol/L (ref 22–32)
Calcium: 9.5 mg/dL (ref 8.9–10.3)
Chloride: 105 mmol/L (ref 98–111)
Creatinine, Ser: 0.82 mg/dL (ref 0.44–1.00)
GFR, Estimated: >60 mL/min (ref >60)
Glucose, Bld: 97 mg/dL (ref 70–99)
Potassium: 3.7 mmol/L (ref 3.5–5.1)
Sodium: 139 mmol/L (ref 135–145)

## 2021-08-27 LAB — GLUCOSE, CAPILLARY: Glucose-Capillary: 86 mg/dL (ref 70–99)

## 2021-08-27 LAB — SURGICAL PCR SCREEN
MRSA, PCR: NEGATIVE
Staphylococcus aureus: NEGATIVE

## 2021-08-27 LAB — I-STAT BETA HCG BLOOD, ED (MC, WL, AP ONLY): I-stat hCG, quantitative: <5 m[IU]/mL (ref <5)

## 2021-08-27 LAB — ABO/RH: ABO/RH(D): O POS

## 2021-08-27 SURGERY — POSTERIOR LUMBAR FUSION 1 LEVEL
Anesthesia: General | Site: Back

## 2021-08-27 MED ORDER — POTASSIUM CHLORIDE IN NACL 20-0.9 MEQ/L-% IV SOLN: INTRAVENOUS | Status: DC

## 2021-08-27 MED ORDER — SODIUM CHLORIDE 0.9 % IV SOLN: 250.0000 mL | INTRAVENOUS | Status: DC

## 2021-08-27 MED ORDER — CEFAZOLIN SODIUM-DEXTROSE 1-4 GM/50ML-% IV SOLN
1.0000 g | Freq: Three times a day (TID) | INTRAVENOUS | Status: AC
  Administered 2021-08-27 – 2021-08-28 (×3): 1 g via INTRAVENOUS
  Filled 2021-08-27 (×3): qty 50

## 2021-08-27 MED ORDER — ONDANSETRON HCL 4 MG/2ML IJ SOLN
4.0000 mg | Freq: Once | INTRAMUSCULAR | Status: AC
Start: 2021-08-27 — End: 2021-08-27
  Administered 2021-08-27: 4 mg via INTRAVENOUS
  Filled 2021-08-27: qty 2

## 2021-08-27 MED ORDER — POLYETHYLENE GLYCOL 3350 17 G PO PACK
17.0000 g | PACK | Freq: Every day | ORAL | Status: DC | PRN
  Administered 2021-08-29: 17 g via ORAL
  Filled 2021-08-27: qty 1

## 2021-08-27 MED ORDER — MENTHOL 3 MG MT LOZG
1.0000 | LOZENGE | OROMUCOSAL | Status: DC | PRN
Start: 2021-08-27 — End: 2021-08-30

## 2021-08-27 MED ORDER — PROPOFOL 10 MG/ML IV BOLUS
INTRAVENOUS | Status: DC | PRN
  Administered 2021-08-27: 120 mg via INTRAVENOUS

## 2021-08-27 MED ORDER — THROMBIN 5000 UNITS EX SOLR
OROMUCOSAL | Status: DC | PRN
  Administered 2021-08-27: 5 mL via TOPICAL

## 2021-08-27 MED ORDER — BUPIVACAINE HCL (PF) 0.5 % IJ SOLN
INTRAMUSCULAR | Status: AC
  Filled 2021-08-27: qty 30

## 2021-08-27 MED ORDER — OXYCODONE HCL 5 MG PO TABS
5.0000 mg | ORAL_TABLET | ORAL | Status: DC | PRN
  Administered 2021-08-27 – 2021-08-30 (×3): 5 mg via ORAL
  Filled 2021-08-27 (×4): qty 1

## 2021-08-27 MED ORDER — LIDOCAINE 2% (20 MG/ML) 5 ML SYRINGE
INTRAMUSCULAR | Status: AC
  Filled 2021-08-27: qty 5

## 2021-08-27 MED ORDER — HYDROMORPHONE HCL 1 MG/ML IJ SOLN
0.5000 mg | Freq: Once | INTRAMUSCULAR | Status: AC
  Administered 2021-08-27: 0.5 mg via INTRAVENOUS
  Filled 2021-08-27: qty 1

## 2021-08-27 MED ORDER — METHOCARBAMOL 1000 MG/10ML IJ SOLN: 500.0000 mg | Freq: Four times a day (QID) | INTRAVENOUS | Status: DC | PRN

## 2021-08-27 MED ORDER — AMISULPRIDE (ANTIEMETIC) 5 MG/2ML IV SOLN
INTRAVENOUS | Status: AC
  Filled 2021-08-27: qty 4

## 2021-08-27 MED ORDER — FENTANYL CITRATE (PF) 250 MCG/5ML IJ SOLN
INTRAMUSCULAR | Status: AC
  Filled 2021-08-27: qty 5

## 2021-08-27 MED ORDER — BUPIVACAINE LIPOSOME 1.3 % IJ SUSP
INTRAMUSCULAR | Status: AC
  Filled 2021-08-27: qty 20

## 2021-08-27 MED ORDER — FENTANYL CITRATE (PF) 250 MCG/5ML IJ SOLN
INTRAMUSCULAR | Status: DC | PRN
Start: 2021-08-27 — End: 2021-08-27
  Administered 2021-08-27: 100 ug via INTRAVENOUS

## 2021-08-27 MED ORDER — PROMETHAZINE HCL 25 MG/ML IJ SOLN: 6.2500 mg | INTRAMUSCULAR | Status: DC | PRN

## 2021-08-27 MED ORDER — DOCUSATE SODIUM 100 MG PO CAPS
100.0000 mg | ORAL_CAPSULE | Freq: Two times a day (BID) | ORAL | Status: DC
  Administered 2021-08-27 – 2021-08-30 (×6): 100 mg via ORAL
  Filled 2021-08-27 (×6): qty 1

## 2021-08-27 MED ORDER — BUPIVACAINE LIPOSOME 1.3 % IJ SUSP
INTRAMUSCULAR | Status: DC | PRN
  Administered 2021-08-27: 20 mL

## 2021-08-27 MED ORDER — CEFAZOLIN SODIUM-DEXTROSE 2-4 GM/100ML-% IV SOLN
INTRAVENOUS | Status: AC
  Filled 2021-08-27: qty 100

## 2021-08-27 MED ORDER — CHLORHEXIDINE GLUCONATE 0.12 % MT SOLN
OROMUCOSAL | Status: AC
  Administered 2021-08-27: 15 mL via OROMUCOSAL
  Filled 2021-08-27: qty 15

## 2021-08-27 MED ORDER — VANCOMYCIN HCL 1000 MG IV SOLR
INTRAVENOUS | Status: AC
Start: 2021-08-27 — End: ?
  Filled 2021-08-27: qty 20

## 2021-08-27 MED ORDER — MIDAZOLAM HCL 2 MG/2ML IJ SOLN
INTRAMUSCULAR | Status: AC
  Filled 2021-08-27: qty 2

## 2021-08-27 MED ORDER — ONDANSETRON HCL 4 MG/2ML IJ SOLN
4.0000 mg | Freq: Four times a day (QID) | INTRAMUSCULAR | Status: DC | PRN
Start: 2021-08-27 — End: 2021-08-30
  Administered 2021-08-29: 4 mg via INTRAVENOUS
  Filled 2021-08-27: qty 2

## 2021-08-27 MED ORDER — VANCOMYCIN HCL 1000 MG IV SOLR: INTRAVENOUS | Status: DC | PRN

## 2021-08-27 MED ORDER — AMISULPRIDE (ANTIEMETIC) 5 MG/2ML IV SOLN
10.0000 mg | Freq: Once | INTRAVENOUS | Status: AC | PRN
  Administered 2021-08-27: 10 mg via INTRAVENOUS

## 2021-08-27 MED ORDER — GABAPENTIN 300 MG PO CAPS: 300.0000 mg | ORAL_CAPSULE | Freq: Once | ORAL | Status: DC

## 2021-08-27 MED ORDER — SODIUM CHLORIDE (PF) 0.9 % IJ SOLN
INTRAMUSCULAR | Status: DC | PRN
  Administered 2021-08-27: 10 mL

## 2021-08-27 MED ORDER — METHOCARBAMOL 500 MG PO TABS
500.0000 mg | ORAL_TABLET | Freq: Four times a day (QID) | ORAL | Status: DC | PRN
  Administered 2021-08-27 – 2021-08-29 (×6): 500 mg via ORAL
  Filled 2021-08-27 (×6): qty 1

## 2021-08-27 MED ORDER — SUCCINYLCHOLINE CHLORIDE 200 MG/10ML IV SOSY
PREFILLED_SYRINGE | INTRAVENOUS | Status: DC | PRN
  Administered 2021-08-27: 60 mg via INTRAVENOUS

## 2021-08-27 MED ORDER — CHLORHEXIDINE GLUCONATE 0.12 % MT SOLN
15.0000 mL | Freq: Once | OROMUCOSAL | Status: AC
Start: 2021-08-27 — End: 2021-08-27

## 2021-08-27 MED ORDER — ROCURONIUM BROMIDE 10 MG/ML (PF) SYRINGE
PREFILLED_SYRINGE | INTRAVENOUS | Status: DC | PRN
  Administered 2021-08-27: 50 mg via INTRAVENOUS

## 2021-08-27 MED ORDER — SUCCINYLCHOLINE CHLORIDE 200 MG/10ML IV SOSY
PREFILLED_SYRINGE | INTRAVENOUS | Status: AC
  Filled 2021-08-27: qty 10

## 2021-08-27 MED ORDER — THROMBIN 5000 UNITS EX SOLR
CUTANEOUS | Status: AC
Start: 2021-08-27 — End: ?
  Filled 2021-08-27: qty 5000

## 2021-08-27 MED ORDER — MEPERIDINE HCL 25 MG/ML IJ SOLN: 6.2500 mg | INTRAMUSCULAR | Status: DC | PRN

## 2021-08-27 MED ORDER — SODIUM CHLORIDE 0.9% FLUSH
3.0000 mL | Freq: Two times a day (BID) | INTRAVENOUS | Status: DC
  Administered 2021-08-28 – 2021-08-30 (×4): 3 mL via INTRAVENOUS

## 2021-08-27 MED ORDER — BUPIVACAINE HCL (PF) 0.5 % IJ SOLN
INTRAMUSCULAR | Status: DC | PRN
  Administered 2021-08-27: 4.5 mL
  Administered 2021-08-27: 30 mL

## 2021-08-27 MED ORDER — HYDROMORPHONE HCL 1 MG/ML IJ SOLN
0.5000 mg | INTRAMUSCULAR | Status: DC | PRN
  Administered 2021-08-28 – 2021-08-30 (×7): 0.5 mg via INTRAVENOUS
  Filled 2021-08-27 (×7): qty 0.5

## 2021-08-27 MED ORDER — DEXAMETHASONE SODIUM PHOSPHATE 10 MG/ML IJ SOLN
INTRAMUSCULAR | Status: DC | PRN
  Administered 2021-08-27: 10 mg via INTRAVENOUS

## 2021-08-27 MED ORDER — ACETAMINOPHEN 325 MG PO TABS
650.0000 mg | ORAL_TABLET | ORAL | Status: DC | PRN
  Administered 2021-08-28 – 2021-08-30 (×9): 650 mg via ORAL
  Filled 2021-08-27 (×9): qty 2

## 2021-08-27 MED ORDER — MIDAZOLAM HCL 5 MG/5ML IJ SOLN
INTRAMUSCULAR | Status: DC | PRN
  Administered 2021-08-27: 2 mg via INTRAVENOUS

## 2021-08-27 MED ORDER — KETOROLAC TROMETHAMINE 30 MG/ML IJ SOLN
INTRAMUSCULAR | Status: DC | PRN
  Administered 2021-08-27: 30 mg via INTRAVENOUS

## 2021-08-27 MED ORDER — LIDOCAINE-EPINEPHRINE 1 %-1:100000 IJ SOLN
INTRAMUSCULAR | Status: DC | PRN
  Administered 2021-08-27: 4.5 mL via INTRADERMAL

## 2021-08-27 MED ORDER — ONDANSETRON HCL 4 MG/2ML IJ SOLN
INTRAMUSCULAR | Status: DC | PRN
  Administered 2021-08-27: 4 mg via INTRAVENOUS

## 2021-08-27 MED ORDER — BUPIVACAINE HCL (PF) 0.5 % IJ SOLN
INTRAMUSCULAR | Status: AC
Start: 2021-08-27 — End: ?
  Filled 2021-08-27: qty 30

## 2021-08-27 MED ORDER — BACITRACIN ZINC 500 UNIT/GM EX OINT
TOPICAL_OINTMENT | CUTANEOUS | Status: AC
  Filled 2021-08-27: qty 28.35

## 2021-08-27 MED ORDER — ACETAMINOPHEN 500 MG PO TABS
ORAL_TABLET | ORAL | Status: AC
  Administered 2021-08-27: 1000 mg via ORAL
  Filled 2021-08-27: qty 2

## 2021-08-27 MED ORDER — ACETAMINOPHEN 650 MG RE SUPP: 650.0000 mg | RECTAL | Status: DC | PRN

## 2021-08-27 MED ORDER — SODIUM CHLORIDE 0.9% FLUSH: 3.0000 mL | INTRAVENOUS | Status: DC | PRN

## 2021-08-27 MED ORDER — ONDANSETRON HCL 4 MG/2ML IJ SOLN
4.0000 mg | Freq: Once | INTRAMUSCULAR | Status: AC
  Administered 2021-08-27: 4 mg via INTRAVENOUS
  Filled 2021-08-27: qty 2

## 2021-08-27 MED ORDER — ENOXAPARIN SODIUM 40 MG/0.4ML IJ SOSY
40.0000 mg | PREFILLED_SYRINGE | INTRAMUSCULAR | Status: DC
Start: 2021-08-28 — End: 2021-08-30
  Administered 2021-08-28 – 2021-08-30 (×3): 40 mg via SUBCUTANEOUS
  Filled 2021-08-27 (×3): qty 0.4

## 2021-08-27 MED ORDER — OXYCODONE HCL 5 MG PO TABS
10.0000 mg | ORAL_TABLET | ORAL | Status: DC | PRN
  Administered 2021-08-28 – 2021-08-30 (×10): 10 mg via ORAL
  Filled 2021-08-27 (×10): qty 2

## 2021-08-27 MED ORDER — ONDANSETRON HCL 4 MG/2ML IJ SOLN
INTRAMUSCULAR | Status: AC
  Filled 2021-08-27: qty 2

## 2021-08-27 MED ORDER — ORAL CARE MOUTH RINSE: 15.0000 mL | Freq: Once | OROMUCOSAL | Status: AC

## 2021-08-27 MED ORDER — CEFAZOLIN SODIUM-DEXTROSE 2-4 GM/100ML-% IV SOLN
2.0000 g | INTRAVENOUS | Status: AC
  Administered 2021-08-27: 2 g via INTRAVENOUS

## 2021-08-27 MED ORDER — FLEET ENEMA 7-19 GM/118ML RE ENEM
1.0000 | ENEMA | Freq: Once | RECTAL | Status: AC | PRN
  Administered 2021-08-30: 1 via RECTAL
  Filled 2021-08-27: qty 1

## 2021-08-27 MED ORDER — DEXAMETHASONE SODIUM PHOSPHATE 10 MG/ML IJ SOLN
INTRAMUSCULAR | Status: AC
  Filled 2021-08-27: qty 1

## 2021-08-27 MED ORDER — ACETAMINOPHEN 500 MG PO TABS: 1000.0000 mg | ORAL_TABLET | Freq: Once | ORAL | Status: AC

## 2021-08-27 MED ORDER — MORPHINE SULFATE (PF) 4 MG/ML IV SOLN
4.0000 mg | Freq: Once | INTRAVENOUS | Status: AC
  Administered 2021-08-27: 4 mg via INTRAVENOUS
  Filled 2021-08-27: qty 1

## 2021-08-27 MED ORDER — ROCURONIUM BROMIDE 10 MG/ML (PF) SYRINGE
PREFILLED_SYRINGE | INTRAVENOUS | Status: AC
  Filled 2021-08-27: qty 10

## 2021-08-27 MED ORDER — 0.9 % SODIUM CHLORIDE (POUR BTL) OPTIME
TOPICAL | Status: DC | PRN
  Administered 2021-08-27: 1000 mL

## 2021-08-27 MED ORDER — HYDROMORPHONE HCL 1 MG/ML IJ SOLN: 0.2500 mg | INTRAMUSCULAR | Status: DC | PRN

## 2021-08-27 MED ORDER — LIDOCAINE-EPINEPHRINE 1 %-1:100000 IJ SOLN
INTRAMUSCULAR | Status: AC
  Filled 2021-08-27: qty 1

## 2021-08-27 MED ORDER — PHENOL 1.4 % MT LIQD: 1.0000 | OROMUCOSAL | Status: DC | PRN

## 2021-08-27 MED ORDER — PHENYLEPHRINE HCL-NACL 20-0.9 MG/250ML-% IV SOLN
INTRAVENOUS | Status: DC | PRN
  Administered 2021-08-27: 10 ug/min via INTRAVENOUS

## 2021-08-27 MED ORDER — LIDOCAINE 2% (20 MG/ML) 5 ML SYRINGE
INTRAMUSCULAR | Status: DC | PRN
  Administered 2021-08-27: 60 mg via INTRAVENOUS

## 2021-08-27 MED ORDER — LACTATED RINGERS IV SOLN: INTRAVENOUS | Status: DC

## 2021-08-27 MED ORDER — ONDANSETRON HCL 4 MG PO TABS: 4.0000 mg | ORAL_TABLET | Freq: Four times a day (QID) | ORAL | Status: DC | PRN

## 2021-08-27 MED ORDER — SUGAMMADEX SODIUM 200 MG/2ML IV SOLN
INTRAVENOUS | Status: DC | PRN
  Administered 2021-08-27: 200 mg via INTRAVENOUS

## 2021-08-27 MED ORDER — IOHEXOL 300 MG/ML  SOLN
75.0000 mL | Freq: Once | INTRAMUSCULAR | Status: AC | PRN
  Administered 2021-08-27: 75 mL via INTRAVENOUS

## 2021-08-27 SURGICAL SUPPLY — 80 items
BAG COUNTER SPONGE SURGICOUNT (BAG) ×1 IMPLANT
BAND RUBBER #18 3X1/16 STRL (MISCELLANEOUS) IMPLANT
BASKET BONE COLLECTION (BASKET) ×1 IMPLANT
BLADE CLIPPER SURG (BLADE) IMPLANT
BUR MATCHSTICK NEURO 3.0 LAGG (BURR) ×1 IMPLANT
BUR PRECISION FLUTE 5.0 (BURR) ×1 IMPLANT
CANISTER SUCT 3000ML PPV (MISCELLANEOUS) ×1 IMPLANT
CNTNR URN SCR LID CUP LEK RST (MISCELLANEOUS) ×1 IMPLANT
CONT SPEC 4OZ STRL OR WHT (MISCELLANEOUS) ×1
COVER BACK TABLE 60X90IN (DRAPES) ×1 IMPLANT
DERMABOND ADVANCED (GAUZE/BANDAGES/DRESSINGS) ×2
DERMABOND ADVANCED .7 DNX12 (GAUZE/BANDAGES/DRESSINGS) ×2 IMPLANT
DRAIN JACKSON PRATT 10MM FLAT (MISCELLANEOUS) IMPLANT
DRAPE 3/4 80X56 (DRAPES) ×1 IMPLANT
DRAPE C-ARM 42X72 X-RAY (DRAPES) ×1 IMPLANT
DRAPE C-ARMOR (DRAPES) ×1 IMPLANT
DRAPE LAPAROTOMY 100X72X124 (DRAPES) ×1 IMPLANT
DRAPE MICROSCOPE LEICA (MISCELLANEOUS) IMPLANT
DRSG OPSITE POSTOP 4X6 (GAUZE/BANDAGES/DRESSINGS) IMPLANT
DRSG OPSITE POSTOP 4X8 (GAUZE/BANDAGES/DRESSINGS) IMPLANT
DURAPREP 26ML APPLICATOR (WOUND CARE) ×1 IMPLANT
ELECT BLADE INSULATED 6.5IN (ELECTROSURGICAL) ×1
ELECT REM PT RETURN 9FT ADLT (ELECTROSURGICAL) ×1
ELECTRODE BLDE INSULATED 6.5IN (ELECTROSURGICAL) ×1 IMPLANT
ELECTRODE REM PT RTRN 9FT ADLT (ELECTROSURGICAL) ×1 IMPLANT
EVACUATOR SILICONE 100CC (DRAIN) IMPLANT
GAUZE 4X4 16PLY ~~LOC~~+RFID DBL (SPONGE) IMPLANT
GAUZE SPONGE 4X4 12PLY STRL (GAUZE/BANDAGES/DRESSINGS) IMPLANT
GLOVE BIO SURGEON STRL SZ7 (GLOVE) IMPLANT
GLOVE BIO SURGEON STRL SZ8 (GLOVE) IMPLANT
GLOVE BIOGEL PI IND STRL 7.5 (GLOVE) ×1 IMPLANT
GLOVE BIOGEL PI IND STRL 8.5 (GLOVE) IMPLANT
GLOVE BIOGEL PI INDICATOR 7.5 (GLOVE) ×4
GLOVE BIOGEL PI INDICATOR 8.5 (GLOVE) ×1
GLOVE ECLIPSE 7.5 STRL STRAW (GLOVE) ×2 IMPLANT
GLOVE EXAM NITRILE XL STR (GLOVE) IMPLANT
GLOVE SURG ENC MOIS LTX SZ8 (GLOVE) ×1 IMPLANT
GLOVE SURG UNDER POLY LF SZ8.5 (GLOVE) ×1 IMPLANT
GOWN STRL REUS W/ TWL LRG LVL3 (GOWN DISPOSABLE) ×1 IMPLANT
GOWN STRL REUS W/ TWL XL LVL3 (GOWN DISPOSABLE) ×2 IMPLANT
GOWN STRL REUS W/TWL 2XL LVL3 (GOWN DISPOSABLE) IMPLANT
GOWN STRL REUS W/TWL LRG LVL3 (GOWN DISPOSABLE) ×2
GOWN STRL REUS W/TWL XL LVL3 (GOWN DISPOSABLE) ×2
GRAFT BN 10X1XDBM MAGNIFUSE (Bone Implant) IMPLANT
GRAFT BONE MAGNIFUSE 1X10CM (Bone Implant) ×1 IMPLANT
HEMOSTAT POWDER KIT SURGIFOAM (HEMOSTASIS) ×1 IMPLANT
KIT BASIN OR (CUSTOM PROCEDURE TRAY) ×1 IMPLANT
KIT TURNOVER KIT B (KITS) ×1 IMPLANT
MARKER SPHERE PSV REFLC NDI (MISCELLANEOUS) IMPLANT
MILL BONE PREP (MISCELLANEOUS) ×1 IMPLANT
NDL HYPO 18GX1.5 BLUNT FILL (NEEDLE) IMPLANT
NDL HYPO 21X1.5 SAFETY (NEEDLE) ×1 IMPLANT
NDL SPNL 18GX3.5 QUINCKE PK (NEEDLE) IMPLANT
NEEDLE HYPO 18GX1.5 BLUNT FILL (NEEDLE) IMPLANT
NEEDLE HYPO 21X1.5 SAFETY (NEEDLE) ×1 IMPLANT
NEEDLE HYPO 22GX1.5 SAFETY (NEEDLE) ×1 IMPLANT
NEEDLE SPNL 18GX3.5 QUINCKE PK (NEEDLE) ×2 IMPLANT
NS IRRIG 1000ML POUR BTL (IV SOLUTION) ×1 IMPLANT
PACK LAMINECTOMY NEURO (CUSTOM PROCEDURE TRAY) ×1 IMPLANT
PAD ARMBOARD 7.5X6 YLW CONV (MISCELLANEOUS) ×3 IMPLANT
PUTTY DBF 6CC CORTICAL FIBERS (Putty) IMPLANT
ROD PED SOLERA 5.5X60 (Rod) IMPLANT
ROD SOLERA 80MM (Rod) ×1 IMPLANT
ROD SOLERA 80X5.5XNS TI (Rod) IMPLANT
SCREW CANN SOLERA 5X45 (Screw) IMPLANT
SCREW SET SOLERA (Screw) ×5 IMPLANT
SCREW SET SOLERA TI5.5 (Screw) IMPLANT
SPIKE FLUID TRANSFER (MISCELLANEOUS) ×1 IMPLANT
SPONGE SURGIFOAM ABS GEL 100 (HEMOSTASIS) IMPLANT
SPONGE T-LAP 4X18 ~~LOC~~+RFID (SPONGE) IMPLANT
STAPLER VISISTAT 35W (STAPLE) ×1 IMPLANT
SUT MNCRL AB 4-0 PS2 18 (SUTURE) ×1 IMPLANT
SUT VIC AB 0 CT1 18XCR BRD8 (SUTURE) ×1 IMPLANT
SUT VIC AB 0 CT1 8-18 (SUTURE) ×2
SUT VIC AB 2-0 CP2 18 (SUTURE) ×1 IMPLANT
SYR 30ML LL (SYRINGE) ×1 IMPLANT
TOWEL GREEN STERILE (TOWEL DISPOSABLE) ×1 IMPLANT
TOWEL GREEN STERILE FF (TOWEL DISPOSABLE) ×1 IMPLANT
TRAY FOLEY MTR SLVR 16FR STAT (SET/KITS/TRAYS/PACK) ×1 IMPLANT
WATER STERILE IRR 1000ML POUR (IV SOLUTION) ×1 IMPLANT

## 2021-08-27 NOTE — Progress Notes (Signed)
Orthopedic Tech Progress Note Patient Details:  Courtney Mendez December 15, 1990 [id number] TLSO Brace was delivered to patient's room  Ortho Devices Type of Ortho Device: Lumbar corsett Ortho Device/Splint Location: Back Ortho Device/Splint Interventions: Ordered      Genelle Bal [name] 08/27/2021, 4:14 PM

## 2021-08-27 NOTE — Anesthesia Preprocedure Evaluation (Addendum)
Anesthesia Evaluation  Patient identified by MRN, date of birth, ID band Patient awake    Reviewed: Allergy & Precautions, NPO status , Patient's Chart, lab work & pertinent test results  Airway Mallampati: II  TM Distance: >3 FB Neck ROM: Full    Dental  (+) Dental Advisory Given, Teeth Intact   Pulmonary Current Smoker and Patient abstained from smoking.,    Pulmonary exam normal breath sounds clear to auscultation       Cardiovascular Normal cardiovascular exam+ Valvular Problems/Murmurs MVP  Rhythm:Regular Rate:Normal     Neuro/Psych negative neurological ROS     GI/Hepatic negative GI ROS, Neg liver ROS,   Endo/Other  negative endocrine ROS  Renal/GU negative Renal ROS     Musculoskeletal negative musculoskeletal ROS (+)   Abdominal   Peds  Hematology negative hematology ROS (+)   Anesthesia Other Findings   Reproductive/Obstetrics                            Anesthesia Physical Anesthesia Plan  ASA: 2  Anesthesia Plan: General   Post-op Pain Management: Tylenol PO (pre-op)* and Gabapentin PO (pre-op)*   Induction: Intravenous  PONV Risk Score and Plan: 3 and Ondansetron, Dexamethasone, Treatment may vary due to age or medical condition and Midazolam  Airway Management Planned: Oral ETT  Additional Equipment: None  Intra-op Plan:   Post-operative Plan: Extubation in OR  Informed Consent: I have reviewed the patients History and Physical, chart, labs and discussed the procedure including the risks, benefits and alternatives for the proposed anesthesia with the patient or authorized representative who has indicated his/her understanding and acceptance.     Dental advisory given  Plan Discussed with: CRNA  Anesthesia Plan Comments:        Anesthesia Quick Evaluation
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
## 2021-08-27 NOTE — Care Plan (Signed)
CT waiting on HCG results
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
## 2021-08-27 NOTE — ED Notes (Signed)
Back MRI

## 2021-08-27 NOTE — ED Triage Notes (Signed)
Pt brought to ED by [facility] with c/o accidental fall from second story (approximately 52ft). Pt states that her hands were extended to break fall. By standers reports LOC. Pt complains of severe lower back pain.   EMS Interventions  20g LFA Fentanyl  EMS Vitals  BP 102/68 HR 80 SPO2 98% RA

## 2021-08-27 NOTE — ED Provider Notes (Signed)
Neurosurgery will admit and plans to take patient to the OR today.    Wynetta Fines, MD 08/27/21 [phone]

## 2021-08-27 NOTE — Anesthesia Procedure Notes (Signed)
Procedure Name: Intubation Date/Time: 08/27/2021 11:27 AM  Performed by: Lowella Dell, CRNAPre-anesthesia Checklist: Patient identified, Emergency Drugs available, Suction available and Patient being monitored Patient Re-evaluated:Patient Re-evaluated prior to induction Oxygen Delivery Method: Circle System Utilized Preoxygenation: Pre-oxygenation with 100% oxygen Induction Type: IV induction, Rapid sequence and Cricoid Pressure applied Laryngoscope Size: Mac and 3 Grade View: Grade I Tube type: Oral Tube size: 7.0 mm Number of attempts: 1 Airway Equipment and Method: Stylet Placement Confirmation: ETT inserted through vocal cords under direct vision, positive ETCO2 and breath sounds checked- equal and bilateral Secured at: 22 cm Tube secured with: Tape Dental Injury: Teeth and Oropharynx as per pre-operative assessment

## 2021-08-27 NOTE — Op Note (Signed)
PREOP DIAGNOSIS: L2 Chance fracture   POSTOP DIAGNOSIS: L2 Chance fracture   PROCEDURE: 1.  Open reduction of L2 fracture 2.  L1-2 posterior arthrodesis 3.  L2-3 posterior arthrodesis 4. Segmental instrumentation with cortical pedicle screw and rod construct at L1-2-3 5. Harvest of local autograft 6. Use of morselized allograft 7. Use of intraoperative Stealth navigation with CT   SURGEON: Dr. Hoyt Koch, MD   ASSISTANT:  Docia Barrier, NP.  Please note there were no qualified trainees available to assist with the procedure.   ANESTHESIA: General Endotracheal   EBL: 250 ml   IMPLANTS:  Medtronic 5.0 x 45 mm screws at bilateral L1 and L3, right L2 80 mm/60 mm rods Magnifuse and DBF   SPECIMENS: None   DRAINS: None   COMPLICATIONS: None   CONDITION: Stable to PACU   HISTORY: This is a 31 yo F who fell from a second floor balcony and suffered an unstable L2 flexion/distraction injury with Chance fracture.  Surgical stabilization was recommended.  Risks discussed included but were not limited to bleeding, pain, infection, scar, pseudoarthrosis, CSF leak, neurologic deficit, paralysis, and death.  The patient wished to proceed with surgery and informed consent was obtained.   PROCEDURE IN DETAIL: After informed consent was obtained and witnessed, the patient was brought to the operating room. After induction of general anesthesia, the patient was positioned on the operative table in the prone position on an open [place] table with all pressure points meticulously padded. C-arm confirmed good reduction of fracture with positioning and was used to plan incision spanning L1 to L3. The skin of the low back was then prepped and draped in the usual sterile fashion.  1% lidocaine with epinephrine was injected in the skin and a timeout was performed.  Preoperative antibiotics were administered.  Incision was made with 10 blade.  Subcutaneous tissue and the fascia was incised with  monopolar electrocautery.  The paraspinous muscles and scar were dissected off the lamina in subperiosteal fashion.  Traumatic disruption and discontinuity of the interspinous ligament and ligamentum flavum was encountered as expected. Additional dissection continued out laterally, exposing the transverse processes of L1, L2,and L3 bilaterally which were then decorticated.  Fractured L2 transverse processes, left facet and pedicle were noted. Navigation array was clamped to the L3 spinous process and intraoperative CT obtained with O-arm, with registration.     Using navigated drill, pilot holes and trajectories were drilled through the L1, L3 and right L2 pedicles, followed by navigated tap.  Ball ended feeler confirmed good bony channels circumferentially and at the depth.  Part of the L2-3 and L3-4 was removed with rongeurs for autograft to allow better seating of the pedicle screws.  Using navigation, screws were then placed in the channels with good purchase. Rods were placed bilaterally and secured with screw caps and final tightened.  Final AP and lateral C-arm x-rays showed good placement of the implants.  Autograft and allograft was placed in the lateral gutters between the transverse processes bilaterally.  A 10 flat JP drain was placed in the subfascial space and tunneled out the skin and secured with a stitch.   Wound was irrigated thoroughly.  Exparel mixed with Marcaine was injected into the paraspinous muscles and subcutaneous tissues bilaterally.  Vancomycin powder was sprinkled into the wound.  The fascia was closed with 0 Vicryl stitches.  The dermal layer was closed with 2-0 Vicryl stitches in buried interrupted fashion.  The skin incisions were closed with 4-0 Monocryl subcuticular  manner followed by Dermabond.  Sterile dressings were placed.  Patient was then flipped supine and extubated by the anesthesia service following commands and all 4 extremities.  All counts were correct at the end  of surgery.  No complications were noted.

## 2021-08-27 NOTE — Transfer of Care (Signed)
Immediate Anesthesia Transfer of Care Note  Patient: Courtney Mendez  Procedure(s) Performed: LUMBAR ONE-TWO, LUMBAR TWO-THREE POSTERIOR INSTRUMENTED FUSION AND REDUCTION OF FRACTURE (Back)  Patient Location: PACU  Anesthesia Type:General  Level of Consciousness: drowsy and patient cooperative  Airway & Oxygen Therapy: Patient Spontanous Breathing and Patient connected to nasal cannula oxygen  Post-op Assessment: Report given to RN and Post -op Vital signs reviewed and stable  Post vital signs: Reviewed and stable  Last Vitals:  Vitals Value Taken Time  BP 116/84 08/27/21 1430  Temp 37 C 08/27/21 1430  Pulse 85 08/27/21 1440  Resp 13 08/27/21 1440  SpO2 100 % 08/27/21 1440  Vitals shown include unvalidated device data.  Last Pain:  Vitals:   08/27/21 1430  TempSrc:   PainSc: 0-No pain         Complications: No notable events documented.

## 2021-08-27 NOTE — Anesthesia Postprocedure Evaluation (Signed)
Anesthesia Post Note  Patient: Courtney Mendez  Procedure(s) Performed: LUMBAR ONE-TWO, LUMBAR TWO-THREE POSTERIOR INSTRUMENTED FUSION AND REDUCTION OF FRACTURE (Back)     Patient location during evaluation: PACU Anesthesia Type: General Level of consciousness: sedated and patient cooperative Pain management: pain level controlled Vital Signs Assessment: post-procedure vital signs reviewed and stable Respiratory status: spontaneous breathing Cardiovascular status: stable Anesthetic complications: no   No notable events documented.  Last Vitals:  Vitals:   08/27/21 1522 08/27/21 2125  BP: (!) 113/94 106/75  Pulse: 83 77  Resp: 18 18  Temp: 36.6 C 36.8 C  SpO2: 100% 100%    Last Pain:  Vitals:   08/27/21 2125  TempSrc: Oral  PainSc:                  Lewie Loron

## 2021-08-28 NOTE — Progress Notes (Signed)
Subjective: Patient reports doing well overall. She has appropriate back pain with reports of stiffness in her bilateral hips. NAE ON.  Objective: Vital signs in last 24 hours: Temp:  [97.8 F (36.6 C)-98.7 F (37.1 C)] 97.9 F (36.6 C) (08/27 0524) Pulse Rate:  [61-106] 61 (08/27 0524) Resp:  [10-23] 18 (08/27 0524) BP: (106-135)/(48-96) 114/67 (08/27 0524) SpO2:  [100 %] 100 % (08/27 0524)  Intake/Output from previous day: 08/26 0701 - 08/27 0700 In: 1300 [I.V.:1200; IV Piggyback:100] Out: 1024 [Urine:676; Drains:147; Stool:1; Blood:200] Intake/Output this shift: Total I/O In: -  Out: 25 [Drains:25]  Physical Exam: Patient is awake, A/O X 4, conversant, and in good spirits. Eyes open spontaneously. They are in NAD and VSS. Doing well. Speech is fluent and appropriate. MAEW with good strength that is symmetric bilaterally.  BUE 5/5 throughout, BLE 5/5 throughout. Sensation to light touch is intact. PERLA, EOMI. CNs grossly intact. Dressing is clean dry intact. Incision is well approximated with no drainage, erythema, or edema. TLSO brace in place. JP drain with 147 ml output overnight.     Lab Results: Recent Labs    08/27/21 0210  WBC 6.9  HGB 13.0  HCT 41.1  PLT 351   BMET Recent Labs    08/27/21 0210  NA 139  K 3.7  CL 105  CO2 23  GLUCOSE 97  BUN 5*  CREATININE 0.82  CALCIUM 9.5    Studies/Results: DG Lumbar Spine 2-3 Views  Result Date: 08/27/2021 CLINICAL DATA:  Lumbar spine surgery for L2 fractures seen earlier today. EXAM: LUMBAR SPINE - 2-3 VIEW COMPARISON:  Lumbar spine CT and lumbar spine MRI obtained earlier today. FINDINGS: Three C-arm images of the upper lumbar spine demonstrate bilateral pedicle screw placement at the L1 and L3 levels and right pedicle screw placement at the L2 level. Normal alignment. IMPRESSION: Operative changes, as described above. Electronically Signed   By: Beckie Salts M.D.   On: 08/27/2021 15:03   DG C-Arm 1-60 Min-No  Report  Result Date: 08/27/2021 Fluoroscopy was utilized by the requesting physician.  No radiographic interpretation.   DG O-ARM IMAGE ONLY/NO REPORT  Result Date: 08/27/2021 There is no Radiologist interpretation  for this exam.  MR LUMBAR SPINE WO CONTRAST  Result Date: 08/27/2021 CLINICAL DATA:  Atraumatic low back pain EXAM: MRI LUMBAR SPINE WITHOUT CONTRAST TECHNIQUE: Multiplanar, multisequence MR imaging of the lumbar spine was performed. No intravenous contrast was administered. COMPARISON:  None Available. FINDINGS: Segmentation:  5 lumbar type vertebrae Alignment:  Mild kyphotic deformity at L1-2, traumatic. Vertebrae: Posterior element fracture at L2, best demonstrated by CT. Anterior L2 wedging from fracture. There is ligamentum flavum and supraspinous tear at L1-2 which continues through the inter spinous ligaments. Conus medullaris and cauda equina: Conus extends to the L1 level. Conus and cauda equina appear normal. Paraspinal and other soft tissues: Strain of intrinsic back muscles at the level of injury. Disc levels: L1-2 right paracentral herniation with epidural hemorrhage. No compressive stenosis. IMPRESSION: 1. Flexion distraction fracture at L2 with full-thickness tear at the ligamentum flavum to supraspinous ligament. 2. L1-2 right paracentral protrusion and ventral epidural hemorrhage that is noncompressive. Electronically Signed   By: Tiburcio Pea M.D.   On: 08/27/2021 08:23   DG Pelvis Portable  Result Date: 08/27/2021 CLINICAL DATA:  31 year old female with history of trauma from a fall. EXAM: PORTABLE PELVIS 1-2 VIEWS COMPARISON:  No priors. FINDINGS: There is no evidence of pelvic fracture or diastasis. No pelvic bone lesions  are seen. Iodinated contrast material is noted filling the urinary bladder, presumably from recent CT examination. IMPRESSION: Negative. Electronically Signed   By: Trudie Reed M.D.   On: 08/27/2021 05:04   DG Chest Port 1 View  Result  Date: 08/27/2021 CLINICAL DATA:  31 year old female with history of trauma from a fall. EXAM: PORTABLE CHEST 1 VIEW COMPARISON:  No priors. FINDINGS: Lung volumes are normal. No consolidative airspace disease. No pleural effusions. No pneumothorax. No pulmonary nodule or mass noted. Pulmonary vasculature and the cardiomediastinal silhouette are within normal limits. IMPRESSION: No radiographic evidence of acute cardiopulmonary disease. Electronically Signed   By: Trudie Reed M.D.   On: 08/27/2021 05:03   CT L-SPINE NO CHARGE  Result Date: 08/27/2021 CLINICAL DATA:  Fall. EXAM: CT LUMBAR SPINE WITHOUT CONTRAST TECHNIQUE: Multidetector CT imaging of the lumbar spine was performed without intravenous contrast administration. Multiplanar CT image reconstructions were also generated. RADIATION DOSE REDUCTION: This exam was performed according to the departmental dose-optimization program which includes automated exposure control, adjustment of the mA and/or kV according to patient size and/or use of iterative reconstruction technique. COMPARISON:  None Available. FINDINGS: Segmentation: 5 lumbar type vertebrae. Alignment: Mild kyphotic angulation at L1-2 Vertebrae: Horizontal fracture through the left posterior elements of L2 with distraction and continuation into the left posterosuperior corner. Interspinous widening at L1-2 with central herniation.Bilateral transverse process fractures at L2. Paraspinal and other soft tissues: Posterior soft tissue swelling Disc levels: L1-2 right paracentral herniation. IMPRESSION: 1. L2 flexion distraction type fracture through the left posterior elements reaching the body. L1-2 interspinous widening suggests ligamentum flavum tear. 2. L2 transverse process fractures. 3. L1-2 disc herniation. Electronically Signed   By: Tiburcio Pea M.D.   On: 08/27/2021 04:34   CT ABDOMEN PELVIS W CONTRAST  Result Date: 08/27/2021 CLINICAL DATA:  Blunt abdominal trauma EXAM: CT  ABDOMEN AND PELVIS WITH CONTRAST TECHNIQUE: Multidetector CT imaging of the abdomen and pelvis was performed using the standard protocol following bolus administration of intravenous contrast. RADIATION DOSE REDUCTION: This exam was performed according to the departmental dose-optimization program which includes automated exposure control, adjustment of the mA and/or kV according to patient size and/or use of iterative reconstruction technique. CONTRAST:  57mL OMNIPAQUE IOHEXOL 300 MG/ML  SOLN COMPARISON:  None Available. FINDINGS: Lower chest: No evidence of injury Hepatobiliary: No hepatic injury or perihepatic hematoma. Gallbladder is unremarkable. Pancreas: Negative Spleen: No splenic injury or perisplenic hematoma. Adrenals/Urinary Tract: No adrenal hemorrhage or renal injury identified. Bladder is unremarkable. Stomach/Bowel: No visible injury Vascular/Lymphatic: No evidence of injury Reproductive: Normal Other: Free pelvic fluid which is low-density. Musculoskeletal: Horizontal fracture through the posterior elements of L2 with distraction in continuation into the left posterosuperior corner. Interspinous widening at L1-2 with central herniation.Bilateral transverse process fractures at L2. These results were called by telephone at the time of interpretation on 08/27/2021 at 4:31 am to provider DAN FLOYD , who verbally acknowledged these results. IMPRESSION: 1. Flexion distraction type fracture through the posterior elements of L2 with L1-2 interspinous widening suggesting ligamentum flavum tear. L1-2 disc herniation. L2 transverse process fractures. 2. Small volume pelvic fluid which is simple and usually physiologic. Electronically Signed   By: Tiburcio Pea M.D.   On: 08/27/2021 04:33   CT Head Wo Contrast  Result Date: 08/27/2021 CLINICAL DATA:  Head trauma with abnormal mental status. EXAM: CT HEAD WITHOUT CONTRAST CT CERVICAL SPINE WITHOUT CONTRAST TECHNIQUE: Multidetector CT imaging of the head  and cervical spine was performed following the  standard protocol without intravenous contrast. Multiplanar CT image reconstructions of the cervical spine were also generated. RADIATION DOSE REDUCTION: This exam was performed according to the departmental dose-optimization program which includes automated exposure control, adjustment of the mA and/or kV according to patient size and/or use of iterative reconstruction technique. COMPARISON:  None Available. FINDINGS: CT HEAD FINDINGS Brain: No evidence of swelling, infarction, hemorrhage, hydrocephalus, extra-axial collection or mass lesion/mass effect. Vascular: Negative Skull: Negative for fracture CT CERVICAL SPINE FINDINGS Alignment: Normal. Skull base and vertebrae: No acute fracture. No primary bone lesion or focal pathologic process. Soft tissues and spinal canal: No prevertebral fluid or swelling. No visible canal hematoma. Disc levels:  No degenerative changes Upper chest: Clear apical lungs IMPRESSION: No evidence of intracranial or cervical spine injury. Electronically Signed   By: Tiburcio Pea M.D.   On: 08/27/2021 04:25   CT Cervical Spine Wo Contrast  Result Date: 08/27/2021 CLINICAL DATA:  Head trauma with abnormal mental status. EXAM: CT HEAD WITHOUT CONTRAST CT CERVICAL SPINE WITHOUT CONTRAST TECHNIQUE: Multidetector CT imaging of the head and cervical spine was performed following the standard protocol without intravenous contrast. Multiplanar CT image reconstructions of the cervical spine were also generated. RADIATION DOSE REDUCTION: This exam was performed according to the departmental dose-optimization program which includes automated exposure control, adjustment of the mA and/or kV according to patient size and/or use of iterative reconstruction technique. COMPARISON:  None Available. FINDINGS: CT HEAD FINDINGS Brain: No evidence of swelling, infarction, hemorrhage, hydrocephalus, extra-axial collection or mass lesion/mass effect.  Vascular: Negative Skull: Negative for fracture CT CERVICAL SPINE FINDINGS Alignment: Normal. Skull base and vertebrae: No acute fracture. No primary bone lesion or focal pathologic process. Soft tissues and spinal canal: No prevertebral fluid or swelling. No visible canal hematoma. Disc levels:  No degenerative changes Upper chest: Clear apical lungs IMPRESSION: No evidence of intracranial or cervical spine injury. Electronically Signed   By: Tiburcio Pea M.D.   On: 08/27/2021 04:25   CT Thoracic Spine Wo Contrast  Result Date: 08/27/2021 CLINICAL DATA:  Ataxia.  Thoracic trauma. EXAM: CT THORACIC SPINE WITHOUT CONTRAST TECHNIQUE: Multidetector CT images of the thoracic were obtained using the standard protocol without intravenous contrast. RADIATION DOSE REDUCTION: This exam was performed according to the departmental dose-optimization program which includes automated exposure control, adjustment of the mA and/or kV according to patient size and/or use of iterative reconstruction technique. COMPARISON:  None Available. FINDINGS: Alignment: Normal. Vertebrae: No acute fracture or focal pathologic process. Paraspinal and other soft tissues: Negative. Disc levels: No degenerative changes. IMPRESSION: Negative thoracic spine CT. Electronically Signed   By: Tiburcio Pea M.D.   On: 08/27/2021 04:22    Assessment/Plan: Patient is post-op day 1 s/p reduction of fracture and posterior instrumented fusion at L1-3 due to fall with L2 flexion/distraction injury with Chance fracture. She is recovering well. She has appropriate surgical pain with complaints of soreness and stiffness in her low to mid back and bilateral hips. She is awaiting PT/OT evaluation.  Continue TLSO brace when OOB. Continue working on pain control, mobility and ambulating patient. Continue JP drain with discontinuation tomorrow. Will likely be ready for discharge tomorrow.   LOS: 1 day    Council Mechanic, DNP, AGNP-C Neurosurgery  Nurse Practitioner  [facility] [address], [address], [place], [place] [address] P: [phone]    F: [phone]  08/28/2021 10:20 AM

## 2021-08-28 NOTE — Evaluation (Signed)
Occupational Therapy Evaluation Patient Details Name: Courtney Mendez MRN: [id number] DOB: 1990/04/29 Today's Date: 08/28/2021   History of Present Illness Pt is a 31 y.o. F who presents after a fall from a 2nd floor baclony with L2 flexion distraction injury with chance fracture now s/p L1-3 PLIF 08/27/2021. Significant PMH: none.   Clinical Impression   PTA, pt was living at home with her wife in a level entry apartment. Pt has 2 flights of stairs to the bed/bath but is able to remain on the main level with bed and use of BSC. Pt is an Best boy at [employer] Pt currently requires supervision for toilet transfers, LB dressing and grooming at sink level. She requires minA for donning her TLSO while sitting EOB. Pt will benefit from continued OT services acutely to address back precautions with ADL completion, tub transfers and independence with donning/doffing back brace. Pt's wife present during session and is available to assist pt at home at d/c. Will continue to follow acutely.       Recommendations for follow up therapy are one component of a multi-disciplinary discharge planning process, led by the attending physician.  Recommendations may be updated based on patient status, additional functional criteria and insurance authorization.   Follow Up Recommendations  No OT follow up    Assistance Recommended at Discharge PRN  Patient can return home with the following A little help with bathing/dressing/bathroom    Functional Status Assessment  Patient has had a recent decline in their functional status and demonstrates the ability to make significant improvements in function in a reasonable and predictable amount of time.  Equipment Recommendations  None recommended by OT    Recommendations for Other Services       Precautions / Restrictions Precautions Precautions: Back Precaution Booklet Issued: Yes (comment) Precaution Comments: verbally reviewed, provided written handout Required  Braces or Orthoses: Spinal Brace Spinal Brace: Applied in sitting position;Thoracolumbosacral orthotic Restrictions Weight Bearing Restrictions: No      Mobility Bed Mobility Overal bed mobility: Modified Independent             General bed mobility comments: Cues for log roll technique, HOB elevated 22 degrees. No physical assist required    Transfers Overall transfer level: Needs assistance Equipment used: Rolling walker (2 wheels) Transfers: Sit to/from Stand Sit to Stand: Supervision           General transfer comment: Cues for hand placement      Balance Overall balance assessment: Mild deficits observed, not formally tested                                         ADL either performed or assessed with clinical judgement   ADL Overall ADL's : Needs assistance/impaired Eating/Feeding: Set up;Sitting   Grooming: Supervision/safety;Standing Grooming Details (indicate cue type and reason): cues for compensatory/adaptive strategies Upper Body Bathing: Set up;Sitting   Lower Body Bathing: Supervison/ safety;Sit to/from stand   Upper Body Dressing : Minimal assistance Upper Body Dressing Details (indicate cue type and reason): with donning/doffing back brace, educated pt on wear schedule Lower Body Dressing: Supervision/safety;Sit to/from stand Lower Body Dressing Details (indicate cue type and reason): pt able to figure-4 Toilet Transfer: Supervision/safety;Ambulation Toilet Transfer Details (indicate cue type and reason): with BSC over toilet cues for safe hand placement Toileting- Clothing Manipulation and Hygiene: Supervision/safety;Sit to/from stand       Functional  mobility during ADLs: Supervision/safety;Rolling walker (2 wheels) General ADL Comments: pt limited by pain, decreased activity tolerance and decreased knowledge of back precautions     Vision         Perception     Praxis      Pertinent Vitals/Pain Pain  Assessment Pain Assessment: 0-10 Pain Score: 7  Pain Location: back Pain Descriptors / Indicators: Sore, Burning, Sharp Pain Intervention(s): Limited activity within patient's tolerance, Monitored during session     Hand Dominance Right   Extremity/Trunk Assessment Upper Extremity Assessment Upper Extremity Assessment: Overall WFL for tasks assessed   Lower Extremity Assessment Lower Extremity Assessment: Defer to PT evaluation RLE Deficits / Details: Strength 5/5 LLE Deficits / Details: Strength 5/5   Cervical / Trunk Assessment Cervical / Trunk Assessment: Back Surgery   Communication Communication Communication: No difficulties   Cognition Arousal/Alertness: Awake/alert Behavior During Therapy: WFL for tasks assessed/performed Overall Cognitive Status: Within Functional Limits for tasks assessed                                       General Comments  vss on RA    Exercises     Shoulder Instructions      Home Living Family/patient expects to be discharged to:: Private residence Living Arrangements: Spouse/significant other;Children Available Help at Discharge: Family Type of Home: Apartment Home Access: Level entry     Home Layout: Two level Alternate Level Stairs-Number of Steps: 2 flights   Bathroom Shower/Tub: Chief Strategy Officer: Standard     Home Equipment: Agricultural consultant (2 wheels);BSC/3in1;Shower seat   Additional Comments: pt's wife reports there is a bed available on main level and pt will use BSC if needed      Prior Functioning/Environment Prior Level of Function : Independent/Modified Independent               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        OT Problem List: Impaired balance (sitting and/or standing);Decreased knowledge of precautions;Decreased knowledge of use of DME or AE;Pain      OT Treatment/Interventions: Self-care/ADL training;Therapeutic exercise;DME and/or AE  instruction;Patient/family education;Balance training    OT Goals(Current goals can be found in the care plan section) Acute Rehab OT Goals Patient Stated Goal: to go home tomorrow OT Goal Formulation: With patient Time For Goal Achievement: 09/11/21 Potential to Achieve Goals: Good ADL Goals Pt Will Perform Tub/Shower Transfer: Tub transfer;with supervision;ambulating Additional ADL Goal #1: Pt will demonstrate adherence to 3/3 back precautions independently.  OT Frequency: Min 2X/week    Co-evaluation PT/OT/SLP Co-Evaluation/Treatment: Yes Reason for Co-Treatment: To address functional/ADL transfers;For patient/therapist safety   OT goals addressed during session: ADL's and self-care      AM-PAC OT "6 Clicks" Daily Activity     Outcome Measure Help from another person eating meals?: None Help from another person taking care of personal grooming?: A Little Help from another person toileting, which includes using toliet, bedpan, or urinal?: A Little Help from another person bathing (including washing, rinsing, drying)?: A Little Help from another person to put on and taking off regular upper body clothing?: A Little Help from another person to put on and taking off regular lower body clothing?: A Little 6 Click Score: 19   End of Session Equipment Utilized During Treatment: Rolling walker (2 wheels);Back brace Nurse Communication: Mobility status  Activity Tolerance: Patient  tolerated treatment well Patient left: in bed;with call bell/phone within reach;Other (comment) (with NP present)  OT Visit Diagnosis: Other abnormalities of gait and mobility (R26.89);Pain Pain - Right/Left:  (back)                Time: 3810-1751 OT Time Calculation (min): 20 min Charges:  OT General Charges $OT Visit: 1 Visit OT Evaluation $OT Eval Low Complexity: 1 Low  Joli Koob OTR/L Acute Rehabilitation Services Office: [phone]   Rebeca Alert 08/28/2021, 10:44 AM

## 2021-08-28 NOTE — Evaluation (Signed)
Physical Therapy Evaluation Patient Details Name: Courtney Mendez MRN: [id number] DOB: 1990/05/10 Today's Date: 08/28/2021  History of Present Illness  Pt is a 31 y.o. F who presents after a fall from a 2nd floor baclony with L2 flexion distraction injury with chance fracture now s/p L1-3 PLIF 08/27/2021. Significant PMH: none.  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 Pt overall is pleasant and motivated to participate. Reports incisional and bilateral hip pain/stiffness. Displays 5/5 bilateral lower extremity strength and denies numbness/tingling. Reviewed brace use; pt spouse present and verbalized understanding. Pt ambulating room distances with a walker at a supervision level. Anticipate good progress.        Recommendations for follow up therapy are one component of a multi-disciplinary discharge planning process, led by the attending physician.  Recommendations may be updated based on patient status, additional functional criteria and insurance authorization.  Follow Up Recommendations Outpatient PT      Assistance Recommended at Discharge PRN  Patient can return home with the following  Assistance with cooking/housework;Assist for transportation;Help with stairs or ramp for entrance    Equipment Recommendations None recommended by PT (pt already equipped)  Recommendations for Other Services       Functional Status Assessment Patient has had a recent decline in their functional status and demonstrates the ability to make significant improvements in function in a reasonable and predictable amount of time.     Precautions / Restrictions Precautions Precautions: Back Precaution Booklet Issued: Yes (comment) Precaution Comments: verbally reviewed, provided written handout Required Braces or Orthoses: Spinal Brace Spinal Brace: Applied in sitting position;Thoracolumbosacral orthotic Restrictions Weight  Bearing Restrictions: No      Mobility  Bed Mobility Overal bed mobility: Modified Independent             General bed mobility comments: Cues for log roll technique, HOB elevated 22 degrees. No physical assist required    Transfers Overall transfer level: Needs assistance Equipment used: Rolling walker (2 wheels) Transfers: Sit to/from Stand Sit to Stand: Supervision           General transfer comment: Cues for hand placement    Ambulation/Gait Ambulation/Gait assistance: Supervision Gait Distance (Feet): 30 Feet Assistive device: Rolling walker (2 wheels) Gait Pattern/deviations: Step-through pattern, Decreased stride length Gait velocity: decreased     General Gait Details: Cues for upright posture, slow and steady pace  Stairs            Wheelchair Mobility    Modified Rankin (Stroke Patients Only)       Balance Overall balance assessment: Mild deficits observed, not formally tested                                           Pertinent Vitals/Pain      Home Living Family/patient expects to be discharged to:: Private residence Living Arrangements: Spouse/significant other;Children Available Help at Discharge: Family Type of Home: Apartment Home Access: Level entry     Alternate Level Stairs-Number of Steps: 2 flights Home Layout: Two level Home Equipment: Agricultural consultant (2 wheels);BSC/3in1;Shower seat Additional Comments: pt's wife reports there is a bed available on main level and pt will use BSC if needed    Prior Function Prior Level of Function : Independent/Modified Independent  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     Hand Dominance   Dominant Hand: Right    Extremity/Trunk Assessment   Upper Extremity Assessment Upper Extremity Assessment: Overall WFL for tasks assessed    Lower Extremity Assessment Lower Extremity Assessment: Defer to PT evaluation RLE Deficits /  Details: Strength 5/5 LLE Deficits / Details: Strength 5/5    Cervical / Trunk Assessment Cervical / Trunk Assessment: Back Surgery  Communication   Communication: No difficulties  Cognition                                                General Comments      Exercises     Assessment/Plan    PT Assessment Patient needs continued PT services  PT Problem List Decreased activity tolerance;Decreased balance;Decreased mobility;Pain       PT Treatment Interventions DME instruction;Gait training;Stair training;Functional mobility training;Therapeutic activities;Therapeutic exercise;Balance training;Patient/family education    PT Goals (Current goals can be found in the Care Plan section)  Acute Rehab PT Goals Patient Stated Goal: to return to school PT Goal Formulation: With patient/family Time For Goal Achievement: 09/11/21 Potential to Achieve Goals: Good    Frequency Min 5X/week     Co-evaluation   Reason for Co-Treatment: To address functional/ADL transfers;For patient/therapist safety   OT goals addressed during session: ADL's and self-care       AM-PAC PT "6 Clicks" Mobility  Outcome Measure Help needed turning from your back to your side while in a flat bed without using bedrails?: None Help needed moving from lying on your back to sitting on the side of a flat bed without using bedrails?: None Help needed moving to and from a bed to a chair (including a wheelchair)?: A Little Help needed standing up from a chair using your arms (e.g., wheelchair or bedside chair)?: A Little Help needed to walk in hospital room?: A Little Help needed climbing 3-5 steps with a railing? : A Little 6 Click Score: 20    End of Session Equipment Utilized During Treatment: Back brace Activity Tolerance: Patient tolerated treatment well Patient left: in bed;with call bell/phone within reach;with family/visitor present Nurse Communication: Mobility status PT  Visit Diagnosis: Difficulty in walking, not elsewhere classified (R26.2);Pain Pain - part of body:  (back, hips)    Time: 8366-2947 PT Time Calculation (min) (ACUTE ONLY): 25 min   Charges:   PT Evaluation $PT Eval Low Complexity: 1 Low PT Treatments $Therapeutic Activity: 8-22 mins        Lillia Pauls, PT, DPT Acute Rehabilitation Services Office [phone]   Norval Morton 08/28/2021, 10:39 AM

## 2021-08-28 NOTE — Plan of Care (Signed)

## 2021-08-28 NOTE — Progress Notes (Signed)
Mobility Specialist Progress Note   08/28/21 1835  Mobility  Activity Ambulated with assistance in hallway  Level of Assistance Minimal assist, patient does 75% or more  Assistive Device Front wheel walker  Distance Ambulated (ft) 80 ft  Activity Response Tolerated well  $Mobility charge 1 Mobility   Received pt in doorway eager to ambulate. No faults during ambulation but pain in back increased. Returned back to EOB where pt was educated on back precaution and then assisted in doffing back brace. Standby assist to get back supine, left w/ call bell in reach and needs met.    Holland Falling Mobility Specialist MS [facility] #:  [phone] Acute Rehab Office:  [phone]

## 2021-08-29 DIAGNOSIS — R531 Weakness: Secondary | ICD-10-CM | POA: Diagnosis not present

## 2021-08-29 MED ORDER — METHOCARBAMOL 500 MG PO TABS: 500.0000 mg | ORAL_TABLET | Freq: Three times a day (TID) | ORAL | 0 refills | Status: AC | PRN

## 2021-08-29 MED ORDER — OXYCODONE-ACETAMINOPHEN 5-325 MG PO TABS: 1.0000 | ORAL_TABLET | ORAL | 0 refills | Status: AC | PRN

## 2021-08-29 MED ORDER — DOCUSATE SODIUM 100 MG PO CAPS: 100.0000 mg | ORAL_CAPSULE | Freq: Two times a day (BID) | ORAL | 0 refills | Status: DC

## 2021-08-29 NOTE — Plan of Care (Signed)
A&Ox4, up w/ 1x - standby assistance w/ TLSO brace.  Ambulated to bathroom & BSC with minimal assist & walker. PRN's for pain given overnight for severe pain to surgical site at back. 73ml output from JP drain.    Problem: Education: Goal: Knowledge of General Education information will improve Description: Including pain rating scale, medication(s)/side effects and non-pharmacologic comfort measures Outcome: Progressing   Problem: Health Behavior/Discharge Planning: Goal: Ability to manage health-related needs will improve Outcome: Progressing   Problem: Clinical Measurements: Goal: Ability to maintain clinical measurements within normal limits will improve Outcome: Progressing Goal: Will remain free from infection Outcome: Progressing Goal: Diagnostic test results will improve Outcome: Progressing Goal: Respiratory complications will improve Outcome: Progressing Goal: Cardiovascular complication will be avoided Outcome: Progressing   Problem: Activity: Goal: Risk for activity intolerance will decrease Outcome: Progressing   Problem: Nutrition: Goal: Adequate nutrition will be maintained Outcome: Progressing   Problem: Coping: Goal: Level of anxiety will decrease Outcome: Progressing   Problem: Elimination: Goal: Will not experience complications related to bowel motility Outcome: Progressing Goal: Will not experience complications related to urinary retention Outcome: Progressing   Problem: Pain Managment: Goal: General experience of comfort will improve Outcome: Progressing   Problem: Safety: Goal: Ability to remain free from injury will improve Outcome: Progressing   Problem: Skin Integrity: Goal: Risk for impaired skin integrity will decrease Outcome: Progressing

## 2021-08-29 NOTE — Progress Notes (Signed)
Mobility Specialist Progress Note  Cancelled: Pt prepping for d/c home and requesting/requiring no further assistance. Mobility Specialist no longer required.  Soraida Vickers Mobility Specialist MS [place] #:  [phone] Acute Rehab Office:  [phone]  

## 2021-08-29 NOTE — Discharge Summary (Signed)
  Physician Discharge Summary  Patient ID: Courtney Mendez MRN: [id number] DOB/AGE: [birth year] 31 y.o.  Admit date: 08/27/2021 Discharge date: 08/29/2021  Admission Diagnoses:  L2 fracture  Discharge Diagnoses:  Same Principal Problem:   Observation after surgery Active Problems:   Closed L2 vertebral fracture [facility])   Discharged Condition: Stable  Hospital Course:  Courtney Mendez is a 31 y.o. female who fell from a 2nd store balcony and suffered an unstable L2 fracture.  She underwent surgery for stabilization.    Postoperatively, she was mobilized with the help of physical therapy and occupational therapy.  Her pain was well controlled with p.o. pain medications and she was voiding without difficulty.  She was deemed ready for discharge home on 08/29/2021.  Treatments: Surgery -   L1 to 3 posterior instrumented fusion and reduction of fracture  Discharge Exam: Blood pressure 110/75, pulse 66, temperature 98.2 F (36.8 C), resp. rate 18, height 5\' 6"  (1.676 m), weight 59 kg, last menstrual period 08/15/2021, SpO2 99 %. Awake, alert, oriented Speech fluent, appropriate CN grossly intact 5/5 BUE/BLE Wound c/d/i  Disposition: Discharge disposition: 01-Home or Self Care       Discharge Instructions     Incentive spirometry RT   Complete by: As directed       Allergies as of 08/29/2021       Reactions   Pork-derived Products Other (See Comments)   Cultural request; ok to use meds derived from prok        Medication List     STOP taking these medications    ibuprofen 200 MG tablet Commonly known as: ADVIL       TAKE these medications    docusate sodium 100 MG capsule Commonly known as: COLACE Take 1 capsule (100 mg total) by mouth 2 (two) times daily.   methocarbamol 500 MG tablet Commonly known as: ROBAXIN Take 1 tablet (500 mg total) by mouth every 8 (eight) hours as needed for muscle spasms.   oxyCODONE-acetaminophen 5-325 MG tablet Commonly  known as: Percocet Take 1 tablet by mouth every 4 (four) hours as needed for severe pain.         Signed: 08/31/2021 08/29/2021, 1:20 PM

## 2021-08-29 NOTE — TOC Transition Note (Signed)
Transition of Care [facility]) - CM/SW Discharge Note   Patient Details  Name: Courtney Mendez MRN: [id number] Date of Birth: December 31, 1990  [facility]) CM/SW Contact:  Bess Kinds, RN Phone Number: [phone] 08/29/2021, 3:04 PM   Clinical Narrative:     Notified by PT that patient needs a walker for transition home today. Referral to [employer] for delivery to the room. No further TOC needs identified at this time.   Final next level of care: Home/Self Care Barriers to Discharge: No Barriers Identified   Patient Goals and CMS Choice     Choice offered to / list presented to : Patient  Discharge Placement                       Discharge Plan and Services                DME Arranged: Walker rolling DME Agency: [employer] Date DME Agency Contacted: 08/29/21 Time DME Agency Contacted: 1502 Representative spoke with at DME Agency: Arnold Long            Social Determinants of Health (SDOH) Interventions     Readmission Risk Interventions     No data to display

## 2021-08-29 NOTE — Progress Notes (Signed)
08/29/21 1020  Clinical Encounter Type  Visited With Patient and family together  Visit Type Initial (Advance Directive Education)  Referral From Physician;Nurse Dorcas Carrow. Marcello Moores, MD; Angelia Mould, RN)  Consult/Referral To Chaplain Melvenia Beam)  Recommendations Advance Directive Education   Chaplain responded to spiritual care consultation requesting assistance for Advance Directive preparation with Ms. Courtney Mendez "Christene Lye". Chaplain met with Ms. Blacksher along with her Wife, Keilani Terrance at patient's bedside.   Ms. Jemmott indicated that she intends to list her Wife, Ms. Pixie Burgener [phone], as her [place].   Chaplain provided the Advance Directive packet as well as education on Advance Directives-documents an individual completes to communicate their health care directions in advance of a time when they may need them. Chaplain informed Ms. Geisler the documents which may be completed here in the hospital are the Living Will and [place].   Chaplain informed Ms. Amaral that the [place] is a legal document in which an individual names another person, as their [place], to communicate her health care decisions, when she is not able to communicate them for herself. The Health Care Agent's function can be temporary or permanent depending on her ability to make and communicate those decisions independently.   Chaplain informed Ms. Bentsen in the absence of a [place], the state of [place] directs health care providers to look to the following individuals in the order listed: legal guardian; an attorney-in-fact under a general power of attorney (POA) if that POA includes the right to make health care decisions; person's spouse; a 77 of her adult children; a 38 of adult brothers and sisters; or an individual who has an established relationship with you,  who is acting in good faith and who can convey your wishes.  If none of these persons are available or willing to make medical decisions on a patient's behalf, the law allows the patient's doctor to make decisions for them as long as another doctor agrees with those decisions.  Chaplain also informed the patient that the Health Care agent has no decision-making authority over any affairs other than those related to her medical care.   The chaplain further educated Ms. Berberich that a Living Will is a legal document that allows her desires not to receive life-prolonging measures in the event that she has a condition that is incurable and will result in her death in a short period of time; they are unconscious, and doctors are confident that she will not regain consciousness; and/or she has advanced dementia or other substantial and irreversible loss of mental function.   The chaplain informed Ms. Butterbaugh that life-prolonging measures are medical treatments that would only serve to postpone death, including breathing machines, kidney dialysis, antibiotics, artificial nutrition and hydration (tube feeding), and similar forms of treatment and that if an individual is able to express their wishes, they may also make them known without the use of a Living Will, but in the event that she is not able to express her wishes, a Living Will allows medical providers and the her family and friends to ensure that they are not making decisions on her behalf, but rather serving as her voice to convey decisions that she has already made.   Ms. Fuente is aware that the decision to create an advance directive is hers alone and she may choose not to complete the documents or may choose to  complete one portion or both.  Ms. Luby was informed that she can revoke the documents at any time by striking through them and writing void or by completing new documents, but that it is also advisable that the individual verbally notify  interested parties that her wishes have changed.   Ms. Edison is also aware that the document must be signed in the presence of a notary public and two witnesses and that this may be done while the patient is still admitted to the hospital or after discharge in the community. If they decide to complete Advance Directives after being discharged from the hospital, they have been advised to notify all interested parties and to provide those documents to their physicians and loved ones in addition to bringing them to the hospital in the event of another hospitalization.   The chaplain informed the Ms. Schlagel that if she desires to proceed with completing the Advance Directive Documentation while she is still admitted, notary services are typically available at [facility] between the hours of 1:00 and 3:30 Monday-Thursday.   When the patient is ready to have these documents completed, the patient should request that their nurse place a spiritual care consult and indicate that the patient is ready to have their advance directives notarized so that arrangements for witnesses and notary public can be made.  If there is an immediate need to have notarized please page the Chaplain.   Please page spiritual care if the patient desires further education or has questions.   [address] [place], [place] [phone].

## 2021-08-29 NOTE — Progress Notes (Signed)
Occupational Therapy Treatment Patient Details Name: Courtney Mendez MRN: [id number] DOB: 1990-04-24 Today's Date: 08/29/2021   History of present illness Pt is a 31 y.o. F who presents after a fall from a 2nd floor baclony with L2 flexion distraction injury with chance fracture now s/p L1-3 PLIF 08/27/2021. Significant PMH: none.   OT comments  Pt making gradual progress towards OT goals, remains limited by reported high pain levels. Overall, pt able to mobilize with RW to/from bathroom with Supervision. Pt requiring up to Min A for TLSO brace mgmt and toileting hygiene today, often asking spouse for assistance. Guided pt in tub transfer practice with min guard provided for safety. Encouraged continued frequent mobility, attempting ADL tasks without assistance.   Recommendations for follow up therapy are one component of a multi-disciplinary discharge planning process, led by the attending physician.  Recommendations may be updated based on patient status, additional functional criteria and insurance authorization.    Follow Up Recommendations  No OT follow up    Assistance Recommended at Discharge PRN  Patient can return home with the following  A little help with bathing/dressing/bathroom   Equipment Recommendations  Other (comment);BSC/3in1 (RW)    Recommendations for Other Services      Precautions / Restrictions Precautions Precautions: Back Required Braces or Orthoses: Spinal Brace Spinal Brace: Applied in sitting position;Thoracolumbosacral orthotic Restrictions Weight Bearing Restrictions: No       Mobility Bed Mobility Overal bed mobility: Needs Assistance Bed Mobility: Supine to Sit, Sit to Supine     Supine to sit: Min assist Sit to supine: Min assist   General bed mobility comments: asing for wife assist with wife cueing for back precautions. opts to sit straight up rather than log rolling    Transfers Overall transfer level: Needs assistance Equipment  used: Rolling walker (2 wheels) Transfers: Sit to/from Stand Sit to Stand: Supervision                 Balance Overall balance assessment: Mild deficits observed, not formally tested                                         ADL either performed or assessed with clinical judgement   ADL Overall ADL's : Needs assistance/impaired                 Upper Body Dressing : Minimal assistance;Sitting Upper Body Dressing Details (indicate cue type and reason): with donning/doffing back brace, educated pt on wear schedule     Toilet Transfer: Supervision/safety;Ambulation;Rolling walker (2 wheels) Toilet Transfer Details (indicate cue type and reason): BSC over toilet Toileting- Clothing Manipulation and Hygiene: Minimal assistance;Sit to/from stand Toileting - Clothing Manipulation Details (indicate cue type and reason): pt requesting wife assist with task despite OT encouraging pt to attempt Tub/ Shower Transfer: Min guard;Tub transfer Tub/Shower Transfer Details (indicate cue type and reason): educated on 2 tub transfer methods with pt able to return demo without LOB or safety concerns Functional mobility during ADLs: Supervision/safety;Rolling walker (2 wheels)      Extremity/Trunk Assessment Upper Extremity Assessment Upper Extremity Assessment: Overall WFL for tasks assessed   Lower Extremity Assessment Lower Extremity Assessment: Defer to PT evaluation        Vision   Vision Assessment?: No apparent visual deficits   Perception     Praxis      Cognition Arousal/Alertness: Awake/alert Behavior During Therapy: [facility]  for tasks assessed/performed Overall Cognitive Status: Within Functional Limits for tasks assessed                                          Exercises      Shoulder Instructions       General Comments Wife present, assisting. Discussed activity recommendations, establishing routine at home    Pertinent Vitals/  Pain       Pain Assessment Pain Assessment: Faces Faces Pain Scale: Hurts even more Pain Location: back Pain Descriptors / Indicators: Sore, Burning, Sharp Pain Intervention(s): Monitored during session, Patient requesting pain meds-RN notified  Home Living                                          Prior Functioning/Environment              Frequency  Min 2X/week        Progress Toward Goals  OT Goals(current goals can now be found in the care plan section)  Progress towards OT goals: Progressing toward goals  Acute Rehab OT Goals Patient Stated Goal: pain control OT Goal Formulation: With patient Time For Goal Achievement: 09/11/21 Potential to Achieve Goals: Good ADL Goals Pt Will Perform Tub/Shower Transfer: Tub transfer;with supervision;ambulating Additional ADL Goal #1: Pt will demonstrate adherence to 3/3 back precautions independently.  Plan Discharge plan remains appropriate    Co-evaluation                 AM-PAC OT "6 Clicks" Daily Activity     Outcome Measure   Help from another person eating meals?: None Help from another person taking care of personal grooming?: A Little Help from another person toileting, which includes using toliet, bedpan, or urinal?: A Little Help from another person bathing (including washing, rinsing, drying)?: A Little Help from another person to put on and taking off regular upper body clothing?: A Little Help from another person to put on and taking off regular lower body clothing?: A Little 6 Click Score: 19    End of Session Equipment Utilized During Treatment: Rolling walker (2 wheels);Back brace  OT Visit Diagnosis: Other abnormalities of gait and mobility (R26.89);Pain   Activity Tolerance Patient tolerated treatment well;Patient limited by pain   Patient Left in bed;with call bell/phone within reach;with family/visitor present   Nurse Communication Mobility status;Patient requests pain  meds        Time: 1202-1235 OT Time Calculation (min): 33 min  Charges: OT General Charges $OT Visit: 1 Visit OT Treatments $Self Care/Home Management : 23-37 mins  Bradd Canary, OTR/L Acute Rehab Services Office: [phone]   Lorre Munroe 08/29/2021, 1:07 PM

## 2021-08-29 NOTE — Progress Notes (Signed)
OT Cancellation Note  Patient Details Name: Courtney Mendez MRN: [id number] DOB: 1990/04/20   Cancelled Treatment:    Reason Eval/Treat Not Completed: Patient declined, no reason specified;Other (comment) Pt reporting nausea and declined OOB attempts at this time. Will follow up for OT session as schedule permits.   Lorre Munroe 08/29/2021, 8:29 AM

## 2021-08-29 NOTE — Progress Notes (Addendum)
PT Cancellation Note  Patient Details Name: Courtney Mendez MRN: [id number] DOB: 1990/07/22   Cancelled Treatment:    Reason Eval/Treat Not Completed: Patient declined, no reason specified Pt politely declining; reports does not feel she needs to practice stairs. Stated she does need a RW as the one she has is in storage; CM notified.  Courtney Mendez, PT, DPT Acute Rehabilitation Services Office [phone]    Norval Morton 08/29/2021, 2:59 PM

## 2021-08-29 NOTE — Discharge Instructions (Addendum)
Can remove transparent dressing and shower 48 hours after surgery Walk as much as possible No heavy lifting >10 lbs Wear TLSO brace when out of bed No excessive bending/twisting at the waist

## 2021-08-30 NOTE — Discharge Summary (Signed)
Physician Discharge Summary  Patient ID: Courtney Mendez MRN: [id number] DOB/AGE: May 04, 1990 31 y.o.  Admit date: 08/27/2021 Discharge date: 08/30/2021  Admission Diagnoses: L2 Chance fracture  Discharge Diagnoses: L2 Chance fracture Principal Problem:   Observation after surgery Active Problems:   Closed L2 vertebral fracture [facility])   Discharged Condition: good  Hospital Course: The patient was admitted on 08/27/2021 after a fall with L2 flexion/distraction injury with Chance fracture. She was then taken to the operating room where the patient underwent reduction of fracture and posterior instrumented fusion at L1-3. The patient tolerated the procedure well and was taken to the recovery room and then to the floor in stable condition. The hospital course was routine. There were no complications. The wound remained clean dry and intact. Pt had appropriate back soreness. No complaints of leg pain or new N/T/W. The patient remained afebrile with stable vital signs, and tolerated a regular diet. The patient continued to increase activities, and pain was well controlled with oral pain medications.   Consults: None  Significant Diagnostic Studies: radiology: X-Ray: intraoperative   Treatments: surgery:  1.  Open reduction of L2 fracture 2.  L1-2 posterior arthrodesis 3.  L2-3 posterior arthrodesis 4. Segmental instrumentation with cortical pedicle screw and rod construct at L1-2-3 5. Harvest of local autograft 6. Use of morselized allograft 7. Use of intraoperative Stealth navigation with CT    Discharge Exam: Blood pressure 106/69, pulse 76, temperature 97.9 F (36.6 C), temperature source Oral, resp. rate 17, height 5\' 6"  (1.676 m), weight 59 kg, last menstrual period 08/15/2021, SpO2 98 %. Physical Exam: Patient is awake, A/O X 4, conversant, and in good spirits. Eyes open spontaneously. They are in NAD and VSS. Doing well. Speech is fluent and appropriate. MAEW with good strength  that is symmetric bilaterally.  BUE 5/5 throughout, BLE 5/5 throughout. Sensation to light touch is intact. PERLA, EOMI. CNs grossly intact. Dressing is clean dry intact. Incision is well approximated with no drainage, erythema, or edema.  Disposition: Discharge disposition: 01-Home or Self Care       Discharge Instructions     Ambulatory referral to Physical Therapy   Complete by: As directed    Incentive spirometry RT   Complete by: As directed       Allergies as of 08/30/2021       Reactions   Pork-derived Products Other (See Comments)   Cultural request; ok to use meds derived from prok        Medication List     STOP taking these medications    ibuprofen 200 MG tablet Commonly known as: ADVIL       TAKE these medications    docusate sodium 100 MG capsule Commonly known as: COLACE Take 1 capsule (100 mg total) by mouth 2 (two) times daily.   methocarbamol 500 MG tablet Commonly known as: ROBAXIN Take 1 tablet (500 mg total) by mouth every 8 (eight) hours as needed for muscle spasms.   oxyCODONE-acetaminophen 5-325 MG tablet Commonly known as: Percocet Take 1 tablet by mouth every 4 (four) hours as needed for severe pain.               Durable Medical Equipment  (From admission, onward)           Start     Ordered   08/29/21 1459  For home use only DME Walker rolling  Once       Question Answer Comment  Walker: With 5 08/31/21  Patient needs a walker to treat with the following condition Pathologic lumbar vertebral fracture      08/29/21 1500            Follow-up Information     Bedelia Person, MD. Schedule an appointment as soon as possible for a visit in 2 week(s).   Specialty: Neurosurgery Contact information: [address] [place] [address] [phone]         Grayce Sessions, NP Follow up.   Specialty: Internal Medicine Contact information: [address] [place]  [address] [phone]                 Signed: Council Mechanic 08/30/2021, 9:59 AM

## 2021-08-31 ENCOUNTER — Telehealth: Payer: Self-pay

## 2021-08-31 NOTE — Telephone Encounter (Signed)
Transition Care Management Follow-up Telephone Call Date of discharge and from where: 08/30/2021, [facility] How have you been since you were released from the hospital? She said it is hard to get around, she is used to being very active.  Any questions or concerns? Yes- she would like a wheelchair. She said that she tires easily when using the walker and her wife would like to take her out of th house. I instructed her to speak to the neurosurgeon about this when when she calls to schedule her follow up appointment.   Items Reviewed: Did the pt receive and understand the discharge instructions provided? Yes  Medications obtained and verified? Yes - she said she has all of her medications.  Other? No  Any new allergies since your discharge? No  Dietary orders reviewed? No Do you have support at home? Yes   Home Care and Equipment/Supplies: Were home health services ordered? no If so, what is the name of the agency? N/a  Has the agency set up a time to come to the patient's home? not applicable Were any new equipment or medical supplies ordered?  Yes: RW,  back brace What is the name of the medical supply agency? [employer] Were you able to get the supplies/equipment? yes Do you have any questions related to the use of the equipment or supplies? No  Functional Questionnaire: (I = Independent and D = Dependent) ADLs: needs some assistance with personal care. Has RW to use with ambulation.  Has been referred for outpatient PT.   Follow up appointments reviewed:  PCP Hospital f/u appt confirmed? Yes  Scheduled to see Gwinda Passe, NP - 09/13/2021.  She will also need to re-establish care. Specialist Hospital f/u appt confirmed?  She needs to call the neurosurgeon to schedule her follow up appointment   Are transportation arrangements needed? No  If their condition worsens, is the pt aware to call PCP or go to the Emergency Dept.? Yes Was the patient provided with contact  information for the PCP's office or ED? Yes Was to pt encouraged to call back with questions or concerns? Yes

## 2021-09-13 ENCOUNTER — Encounter (INDEPENDENT_AMBULATORY_CARE_PROVIDER_SITE_OTHER): Payer: Self-pay | Admitting: Primary Care

## 2021-09-13 ENCOUNTER — Ambulatory Visit (INDEPENDENT_AMBULATORY_CARE_PROVIDER_SITE_OTHER): Payer: Medicaid Other | Admitting: Primary Care

## 2021-09-13 VITALS — BP 125/75 | HR 80 | Resp 16 | Ht 66.0 in | Wt 123.0 lb

## 2021-09-13 DIAGNOSIS — W108XXS Fall (on) (from) other stairs and steps, sequela: Secondary | ICD-10-CM

## 2021-09-13 DIAGNOSIS — Z09 Encounter for follow-up examination after completed treatment for conditions other than malignant neoplasm: Secondary | ICD-10-CM | POA: Diagnosis not present

## 2021-09-13 DIAGNOSIS — Z23 Encounter for immunization: Secondary | ICD-10-CM

## 2021-09-13 DIAGNOSIS — F4323 Adjustment disorder with mixed anxiety and depressed mood: Secondary | ICD-10-CM

## 2021-09-13 MED ORDER — IBUPROFEN 600 MG PO TABS: 600.0000 mg | ORAL_TABLET | Freq: Three times a day (TID) | ORAL | 0 refills | Status: DC | PRN

## 2021-09-13 NOTE — Patient Instructions (Signed)

## 2021-09-15 NOTE — Progress Notes (Signed)
[facility]   Subjective:   Ms. Courtney Mendez is a 31 y.o. female presents for hospital follow up . Patient presented to the ED who fell from a second story balcony after losing her balance.  She complained of severe back pain.  Admit date to the hospital was 08/27/21, patient was discharged from the hospital on 08/30/21, patient was admitted for: Observation after surgery Closed L2 vertebral fracture .  Patient presents today with her partner and a body brace from her shoulders to her stomach supporting her spine.  She is followed by neurology.  Today she is in good spirits and is having anxiety, depression and insomnia.  She expressed he wrote a note for disability unable to work at this time unclear if this will heal properly to return to her normal routine in her life.  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 10 capsule 0   methocarbamol (ROBAXIN) 500 MG tablet Take 1 tablet (500 mg total) by mouth every 8 (eight) hours as needed for muscle spasms. 50 tablet 0   oxyCODONE-acetaminophen (PERCOCET) 5-325 MG tablet Take 1 tablet by mouth every 4 (four) hours as needed for severe pain. 50 tablet 0   No current facility-administered medications on file prior to visit.     Review of System: Comprehensive ROS Pertinent positive and negative noted in HPI    Objective:  BP 125/75   Pulse  80   Resp 16   Ht 5\' 6"  (1.676 m)   Wt 123 lb (55.8 kg)   LMP 08/15/2021   SpO2 100%   BMI 19.85 kg/m   Filed Weights   09/13/21 1025  Weight: 123 lb (55.8 kg)    Physical Exam:/Altered exam General Appearance: Well nourished, in no apparent distress. Eyes: PERRLA, EOMs, conjunctiva no swelling or erythema Sinuses: No Frontal/maxillary tenderness ENT/Mouth: Ext aud canals clear, TMs without erythema, bulging. No erythema, swelling, or exudate on post pharynx.  Tonsils not swollen or erythematous. Hearing normal.  Neck: Supple, thyroid normal.  Respiratory: Respiratory effort normal, difficulty and actually difficulty auscultating lungs with brace on  cardio: RRR with no MRGs.  Abdomen: Soft, + BS.   Lymphatics: Non tender without lymphadenopathy.  Musculoskeletal: Full ROM, 5/5 strength, normal gait.  Skin: Warm, dry without rashes, lesions, ecchymosis.  Neuro: Cranial nerves intact. Normal muscle tone, no cerebellar symptoms. Sensation intact.  Psych: Awake and oriented X 3, normal affect, Insight and Judgment appropriate.    Assessment:  Ardelle was seen today for hospitalization follow-up.  Diagnoses and all orders for this visit: Hospital discharge follow-up Schedule an appointment with Baxter Hire, MD (Neurosurgery) in 2 weeks (09/13/2021) patient is scheduled on the same day for neurology no notes seen that she made her appointment Follow up with 11/13/2021 Randa Evens, NP (Internal Medicine) completed  Fall (on) (  from) other stairs and steps, sequela Followed by neurology Closed L2 vertebral fracture (HCC) -     ibuprofen (ADVIL) 600 MG tablet; Take 1 tablet (600 mg total) by mouth every 8 (eight) hours as needed.  Need for DTaP vaccine -     Tdap vaccine greater than or equal to 7yo IM  Need for immunization against influenza -     Cancel: Flu Vaccine QUAD 6mo+IM (Fluarix, Fluzone & Alfiuria Quad PF)   Adjustment reaction with anxiety and depression Briefly  discussed a tragic trauma that has altered her life at this time is expected to have anxiety and depression also may lead to postpartum depression.  Will consider options of medication and therapy.  To be discussed on follow-up visit   Meds ordered this encounter  Medications   ibuprofen (ADVIL) 600 MG tablet    Sig: Take 1 tablet (600 mg total) by mouth every 8 (eight) hours as needed.    Dispense:  90 tablet    Refill:  0    Order Specific Question:   Supervising Provider    Answer:   Quentin Angst [[id number]]    This note has been created with Dragon speech recognition software and smart phrase technology. Any transcriptional errors are unintentional.   Grayce Sessions, NP 09/15/2021, 10:16 AM

## 2021-09-21 NOTE — Therapy (Deleted)
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   Past Surgical History:  Procedure Laterality Date   NO PAST SURGERIES     Patient Active Problem List   Diagnosis Date Noted   Observation after surgery 08/27/2021   Closed L2 vertebral fracture (HCC) 08/27/2021   Pain in Mendez shoulder 10/01/2019    PCP: Courtney Sessions, NP  REFERRING PROVIDER: Council Mechanic, NP  REFERRING DIAG: [phone] (ICD-10-CM) - Other fracture of second lumbar vertebra, subsequent encounter for fracture with routine healing   Rationale for Evaluation and Treatment Rehabilitation  THERAPY DIAG: ther fracture of second lumbar vertebra, subsequent encounter for fracture with routine healing   ONSET DATE: 08/27/21 surgery  SUBJECTIVE:                                                                                                                                                                                           SUBJECTIVE STATEMENT: *** PERTINENT HISTORY:  Courtney Mendez is a 31 y.o. female presents for hospital follow up . Patient presented to the ED who fell from a second story balcony after losing her balance.  She complained of severe back pain.  Admit date to the hospital was 08/27/21, patient was discharged from the hospital on 08/30/21, patient was admitted for: Observation after surgery Closed L2 vertebral fracture .  Patient presents today with her partner and a body brace from her shoulders to her stomach supporting her spine.  She is followed by neurology.  Today she is in good spirits and is having anxiety, depression and insomnia.  She expressed he wrote a note for disability unable to work at this time unclear if this will heal properly to return to her normal routine in  her life.  She has no bowel incontinence, no numbness tingling or paracentesis.Patient has No headache, No chest pain, No abdominal pain - No Nausea, No new weakness tingling or numbness, No Cough - shortness of breath   PAIN:  Are you having pain? {OPRCPAIN:27236}   PRECAUTIONS: Back  WEIGHT BEARING RESTRICTIONS No  FALLS:  Has patient fallen in last 6 months? Yes. Number of falls 1  LIVING ENVIRONMENT: Lives with: lives with their family  OCCUPATION: ***  PLOF: Independent  PATIENT GOALS To reduce and manage my back pain   OBJECTIVE:   DIAGNOSTIC FINDINGS:  CLINICAL DATA:  Lumbar spine surgery for L2 fractures seen earlier today.  EXAM: LUMBAR SPINE - 2-3 VIEW   COMPARISON:  Lumbar spine CT and lumbar spine MRI obtained earlier today.   FINDINGS: Three C-arm images of the upper lumbar spine demonstrate bilateral pedicle screw placement at the L1 and L3 levels and Mendez pedicle screw placement at the L2 level. Normal alignment.   IMPRESSION: Operative changes, as described above.     Electronically Signed   By: Courtney Mendez M.D.   On: 08/27/2021 15:03  PATIENT SURVEYS:  Modified Oswestry ***   SCREENING FOR RED FLAGS:  Negative  COGNITION:  Overall cognitive status: Within functional limits for tasks assessed     SENSATION: Not tested  MUSCLE LENGTH: Hamstrings: Mendez *** deg; Mendez *** deg Courtney Fus test: Mendez *** deg; Mendez *** deg  POSTURE: {posture:25561}  PALPATION: deferred  LUMBAR ROM:   {AROM/PROM:27142}  A/PROM  eval  Flexion   Extension   Mendez lateral flexion   Mendez lateral flexion   Mendez rotation   Mendez rotation    (Blank rows = not tested)  LOWER EXTREMITY ROM:     {AROM/PROM:27142}  Mendez eval Mendez eval  Hip flexion    Hip extension    Hip abduction    Hip adduction    Hip internal rotation    Hip external rotation    Knee flexion    Knee extension    Ankle dorsiflexion    Ankle plantarflexion    Ankle  inversion    Ankle eversion     (Blank rows = not tested)  LOWER EXTREMITY MMT:    MMT Mendez eval Mendez eval  Hip flexion    Hip extension    Hip abduction    Hip adduction    Hip internal rotation    Hip external rotation    Knee flexion    Knee extension    Ankle dorsiflexion    Ankle plantarflexion    Ankle inversion    Ankle eversion     (Blank rows = not tested)  LUMBAR SPECIAL TESTS:  Deferred due to post-op status  FUNCTIONAL TESTS:  Deferred due to post-op status  GAIT: Distance walked: 86ft x2 Assistive device utilized: {Assistive devices:23999} Level of assistance: {Levels of assistance:24026} Comments: ***    TODAY'S TREATMENT  Eval   PATIENT EDUCATION:  Education details: Discussed eval findings, rehab rationale and POC and patient is in agreement  Person educated: Patient and Caregiver *** Education method: Explanation Education comprehension: verbalized understanding and needs further education   HOME EXERCISE PROGRAM: ***  ASSESSMENT:  CLINICAL IMPRESSION: Patient is a 31 y.o. female who was seen today for physical therapy evaluation and treatment for ***.    OBJECTIVE IMPAIRMENTS {opptimpairments:25111}.   ACTIVITY LIMITATIONS {activitylimitations:27494}  PARTICIPATION LIMITATIONS: {participationrestrictions:25113}  PERSONAL FACTORS {Personal factors:25162} are also affecting patient's functional outcome.   REHAB POTENTIAL: Good  CLINICAL DECISION MAKING: Evolving/moderate complexity  EVALUATION COMPLEXITY: Moderate   GOALS: Goals reviewed with patient? No  SHORT TERM GOALS: Target date: {follow up:25551}  *** Baseline: Goal status: {GOALSTATUS:25110}  2.  *** Baseline:  Goal status: {GOALSTATUS:25110}  3.  *** Baseline:  Goal status: {GOALSTATUS:25110}  4.  *** Baseline:  Goal status: {GOALSTATUS:25110}  5.  *** Baseline:  Goal status: {GOALSTATUS:25110}  6.  *** Baseline:  Goal status:  {GOALSTATUS:25110}  LONG TERM GOALS: Target date: {follow up:25551}  *** Baseline:  Goal status: {GOALSTATUS:25110}  2.  *** Baseline:  Goal status: {GOALSTATUS:25110}  3.  *** Baseline:  Goal status: {GOALSTATUS:25110}  4.  ***  Baseline:  Goal status: {GOALSTATUS:25110}  5.  *** Baseline:  Goal status: {GOALSTATUS:25110}  6.  *** Baseline:  Goal status: {GOALSTATUS:25110}   PLAN: PT FREQUENCY: {rehab frequency:25116}  PT DURATION: {rehab duration:25117}  PLANNED INTERVENTIONS: {rehab planned interventions:25118::"Therapeutic exercises","Therapeutic activity","Neuromuscular re-education","Balance training","Gait training","Patient/Family education","Self Care","Joint mobilization"}.  PLAN FOR NEXT SESSION: ***   Lanice Shirts, PT 09/21/2021, 6:20 PM

## 2021-09-22 ENCOUNTER — Ambulatory Visit: Payer: Medicaid Other

## 2021-09-27 ENCOUNTER — Ambulatory Visit: Payer: Medicaid Other

## 2021-09-29 ENCOUNTER — Ambulatory Visit (INDEPENDENT_AMBULATORY_CARE_PROVIDER_SITE_OTHER): Payer: Medicaid Other | Admitting: Primary Care

## 2021-10-13 ENCOUNTER — Ambulatory Visit: Payer: Medicaid Other | Attending: Primary Care

## 2021-10-13 ENCOUNTER — Ambulatory Visit: Payer: Medicaid Other

## 2021-10-13 DIAGNOSIS — R262 Difficulty in walking, not elsewhere classified: Secondary | ICD-10-CM | POA: Insufficient documentation

## 2021-10-13 DIAGNOSIS — M6281 Muscle weakness (generalized): Secondary | ICD-10-CM | POA: Diagnosis not present

## 2021-10-13 DIAGNOSIS — R252 Cramp and spasm: Secondary | ICD-10-CM | POA: Diagnosis not present

## 2021-10-13 DIAGNOSIS — M5459 Other low back pain: Secondary | ICD-10-CM | POA: Diagnosis not present

## 2021-10-13 NOTE — Therapy (Signed)
OUTPATIENT PHYSICAL THERAPY THORACOLUMBAR EVALUATION   Patient Name: Courtney Mendez MRN: [id number] DOB:[birth year], 31 y.o., female Today's Date: 10/14/2021   PT End of Session - 10/14/21 0553     Visit Number 1    Number of Visits 17    Date for PT Re-Evaluation 12/16/21    Authorization Type [place] MEDICAID HEALTHY BLUE    Progress Note Due on Visit 10    PT Start Time 1121    PT Stop Time 1205    PT Time Calculation (min) 44 min    Equipment Utilized During Treatment Other (comment)   Rollator   Activity Tolerance Patient tolerated treatment well    Behavior During Therapy WFL for tasks assessed/performed             Past Medical History:  Diagnosis Date   Heart murmur    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   THERAPY DIAG:  Other low back pain - Plan: PT plan of care cert/re-cert  Cramp and spasm - Plan: PT plan of care cert/re-cert  Difficulty in walking, not elsewhere classified - Plan: PT plan of care cert/re-cert  Muscle weakness (generalized) - Plan: PT plan of care cert/re-cert  Rationale for Evaluation and Treatment Rehabilitation  ONSET DATE: 08/27/21  SUBJECTIVE:   SUBJECTIVE STATEMENT: Pt reports she injured her back when she fell over the side of stair handrail from the highest step to the 2nd floor. She reports  the first weeks at home were rough, but she is slowly getting better with pain and function. Pt reports she has not lied down completely flat on her bed since being home ,  but uses a memory foam wedge which extends to her buttock to sleep on. Pt states she needs a cushion for her buttock to tolerate sitting. Pt reports she sometimes walks in her home without an assist device. Pt notes she has a return appt with her surgeon on 11/02/21.  PERTINENT HISTORY: Heart murmur  PAIN:  Are you having pain? Yes: NPRS scale: 0/10 Pain location: Low back and upper gluteal, L > R Pain description: Muscle spasms,ache, throb, sharp Aggravating factors: prolonged standing and walking  Relieving factors: pain and muscle relaxer meds, heating pad, sitting, lying down  0-10/10  PRECAUTIONS: Back, no bending, arching, of lifting. Pt is wearing a thoracolumbar immobilization back brace which she can take off only to shower and when lying down.  WEIGHT BEARING RESTRICTIONS No  FALLS:  Has patient fallen in last 6 months? Yes. Number of falls 1  LIVING ENVIRONMENT: Lives with: lives with their family Lives in: House/apartment Stairs: Yes: Internal: 12 steps; on right going up Has following equipment at home: Dan Humphreys - 2 wheeled, Environmental consultant - 4 wheeled, shower chair, and bed side commode  OCCUPATION: Student- online now  PLOF: Independent  PATIENT GOALS To gain 100% or as much function as possible   OBJECTIVE:   DIAGNOSTIC FINDINGS:   Xray 08/27/21 FINDINGS: Three C-arm images of the upper lumbar spine demonstrate bilateral pedicle  screw placement at the L1 and L3 levels and right pedicle screw placement at the L2 level. Normal alignment.   IMPRESSION: Operative changes, as described above.  PATIENT SURVEYS:  LEFS 19/80  COGNITION:  Overall cognitive status: Within functional limits for tasks assessed     SENSATION: Intact, but pt reports her L leg will become numb intermittently, esp with prolonged sitting  EDEMA:  Not observed/palpated of the legs  MUSCLE LENGTH: Hamstrings: Right 70 deg; Left 68 deg Thomas test: Right NT deg; Left NT deg  POSTURE:  forward head; due to LS back brace , unable to assess thoracic and lumbar posture  PALPATION: Pt was not TTP of the lumbar paraspinals  LUMBAR ROM:  NT due to post surgical back restrictions Active  A/PROM  10/14/2021  Flexion   Extension   Right lateral flexion   Left lateral flexion   Right rotation   Left rotation    (Blank rows = not tested)  LOWER EXTREMITY ROM:   Grosssly WNLS Active ROM Right eval Left eval  Hip flexion    Hip extension    Hip abduction    Hip adduction    Hip internal rotation    Hip external rotation    Knee flexion    Knee extension    Ankle dorsiflexion    Ankle plantarflexion    Ankle inversion    Ankle eversion     (Blank rows = not tested)  LOWER EXTREMITY MMT:  MMT Right eval Left eval  Hip flexion 4+ 4+  Hip extension NT NT  Hip abduction 4 4  Hip adduction 5 5  Hip internal rotation 5 5  Hip external rotation 4+ 4+  Knee flexion 5 5  Knee extension 4+ 4+  Ankle dorsiflexion 5 5  Ankle plantarflexion 5 5  Ankle inversion    Ankle eversion     (Blank rows = not tested)  LUMBAR SPECIAL TESTS:  Straight leg raise test: Negative  LOWER EXTREMITY SPECIAL TESTS:  None completed  FUNCTIONAL TESTS:  5 times sit to stand: 24.5' from bariatric chair s use of hands 6 minute walk test: TBA when pt is able to walk without an assist device  GAIT: Distance walked: 200' Assistive device utilized: Environmental consultant - 4 wheeled Level of assistance: Modified independence Comments: Decreased pace, equall step length  TRANSFERS:   -Ind with sit to stand transfer to mat table -Verbal instruction and CGA for sit to supine transfer c log rolling from side to supine. Pt returned to sitting from supine c verbal cueing only. Pt reports tolerating the transfer and lying supine without increase in pain.   TODAY'S TREATMENT: [facility] Adult PT Treatment:                                                DATE: 10/13/21 Therapeutic Exercise: - Sit to Stand  with Arms Crossed 10 reps - 3 hold - Heel Toe Raises with Counter Support 10 reps - 3 hold - Hooklying Clamshell with Resistance BluTB 10 reps - 3 hold Self Care: Transfers and log rolling as above  PATIENT EDUCATION:  Education details: Eval findings, POC, HEP, self care Person educated: Patient and friend Education method: Explanation, Demonstration, Tactile cues, Verbal cues, and Handouts Education comprehension: verbalized understanding, returned demonstration, verbal cues required, and tactile cues required   HOME EXERCISE PROGRAM:  Access Code: [email or web address] URL: [email or web address] Date: 10/14/2021 Prepared by: Joellyn Rued  Exercises - Sit to Stand with Arms Crossed  - 1-2 x daily - 7 x weekly - 1-2 sets - 10 reps - 3 hold - Heel Toe Raises with Counter Support  - 1-2 x daily - 7 x weekly - 1-2 sets - 10 reps - 3 hold - Hooklying Clamshell with Resistance  - 1-2 x daily - 7 x weekly - 1-2 sets - 10 reps - 3 hold  ASSESSMENT:  CLINICAL IMPRESSION: Patient is a 31 y.o. female who was seen today for physical therapy evaluation and treatment for Other fracture of second lumbar vertebra, subsequent encounter for fracture with routine healing following A LUMBAR ONE-TWO, LUMBAR TWO-THREE POSTERIOR INSTRUMENTED FUSION AND REDUCTION OF FRACTURE due to a fall from the 2nd floor in a stair well. Pt is primarily walking with a RW or rollator. . OBJECTIVE IMPAIRMENTS decreased activity tolerance, decreased balance, decreased endurance, decreased mobility, difficulty walking, decreased ROM, decreased strength, impaired flexibility, and pain.   ACTIVITY LIMITATIONS carrying, lifting, bending, sitting, standing, squatting, sleeping, stairs, transfers, bed mobility, bathing, toileting, dressing, locomotion level, and caring for others  PARTICIPATION LIMITATIONS: meal prep, cleaning, laundry, driving, shopping, community activity, and school  PERSONAL FACTORS Time since onset of  injury/illness/exacerbation are also affecting patient's functional outcome.   REHAB POTENTIAL: Good  CLINICAL DECISION MAKING: Evolving/moderate complexity  EVALUATION COMPLEXITY: Moderate   GOALS:  SHORT TERM GOALS: Target date: 11/04/2021  Pt will be Ind in an initial HEP Baseline:initiated Goal status: INITIAL  2.  Pt will voice understanding of measures to assist in pain reduction Baseline: Instruction in bed mobility and proper turning Goal status: INITIAL  LONG TERM GOALS: Target date: 12/16/21  Pt will be Ind in a final HEP to maintain achieved LOF Baseline:Initiated  Goal status: INITIAL  2.  Increase knee strength to 5/5 and hip strength to 4+/5 to optimize functional mobility Baseline: see flow sheets Goal status: INITIAL  3.  Pt will progress to Ind ambualtion with out an assist device on level/uneven surfaces and will ascend/descend steps indly in a reciprocal pattern and use of HR as needed Baseline: Needs a walker for ambulation and SBA on steps for safety  Goal status: INITIAL  4.  Improve 5xSTS by MCID of 5" and by MCID of 70ft as indication of improved functional mobility  Baseline: 5xSTS 24.5 " s use of hands; TBA when pt can walk without an AD Goal status: INITIAL  5.  Pt will demonstrate trunk ROM with only min limitations for optimize function Baseline: Restricted by LS brace Goal status: INITIAL   PLAN: PT FREQUENCY: 2x/week  PT DURATION: 8 weeks  PLANNED INTERVENTIONS: Therapeutic exercises, Therapeutic activity, Balance training, Gait training, Patient/Family education, Self Care, Aquatic Therapy, Dry Needling, Electrical stimulation, Cryotherapy, Moist heat, Taping, Manual therapy, and Re-evaluation  PLAN FOR NEXT SESSION: Review FOTO; assess response to HEP; progress therex as indicated; use of modalities, manual therapy; and TPDN as indicated.  Stephaun Million MS, PT 10/14/21 1:46 PM  Check all possible CPT codes: 44619 - PT  Re-evaluation, 97110- Therapeutic Exercise, [phone] - Gait Training, [phone] - Manual Therapy, 97530 - Therapeutic Activities, [phone] - Self Care, [phone] - Electrical stimulation (Manual), [id number] - Ultrasound, and [id number] - Aquatic therapy    Check all conditions that are expected to impact treatment: Social determinants of health   If treatment provided at initial evaluation, no treatment charged due to lack of  authorization.

## 2021-10-14 ENCOUNTER — Other Ambulatory Visit: Payer: Self-pay

## 2021-10-19 ENCOUNTER — Ambulatory Visit: Payer: Medicaid Other

## 2021-10-25 ENCOUNTER — Ambulatory Visit: Payer: Medicaid Other | Admitting: Physical Therapy

## 2021-10-25 ENCOUNTER — Encounter: Payer: Self-pay | Admitting: Physical Therapy

## 2021-10-25 DIAGNOSIS — M5459 Other low back pain: Secondary | ICD-10-CM | POA: Diagnosis not present

## 2021-10-25 DIAGNOSIS — R252 Cramp and spasm: Secondary | ICD-10-CM

## 2021-10-25 DIAGNOSIS — R262 Difficulty in walking, not elsewhere classified: Secondary | ICD-10-CM

## 2021-10-25 DIAGNOSIS — M6281 Muscle weakness (generalized): Secondary | ICD-10-CM | POA: Diagnosis not present

## 2021-10-25 NOTE — Therapy (Signed)
OUTPATIENT PHYSICAL THERAPY TREATMENT NOTE   Patient Name: Courtney Mendez MRN: [id number] DOB:10/14/1990, 31 y.o., female Today's Date: 10/25/2021  PCP: Kerin Perna, NP   REFERRING PROVIDER: Marvis Moeller, NP  END OF SESSION:   PT End of Session - 10/25/21 1241     Visit Number 2    Number of Visits 17    Date for PT Re-Evaluation 12/16/21    Authorization Type [place] MEDICAID HEALTHY BLUE    Authorization Time Period 10/25/21-12/23/21    Authorization - Visit Number 1    Authorization - Number of Visits 7    Progress Note Due on Visit 10    PT Start Time 1240   pt late   PT Stop Time 1315    PT Time Calculation (min) 35 min             Past Medical History:  Diagnosis Date   Heart murmur    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   THERAPY DIAG:  Other low back pain  Cramp and spasm  Difficulty in walking, not elsewhere classified  Muscle weakness (generalized)  Rationale for Evaluation and Treatment Rehabilitation  PERTINENT HISTORY: Heart murmur   PRECAUTIONS: Back, no bending, arching, of lifting. Pt is wearing a thoracolumbar immobilization back brace which she can take off only to shower and when lying down.   SUBJECTIVE:                                                                                                                                                                                      SUBJECTIVE STATEMENT:  I have been doing the exercises. This is a good day. Pain is 5/10. I was trying to find a facility with aquatic therapy, that's why  I canceled my appointments. I didn't know I could do aquatics here. I will see my doctor on first on the month to see if I can start aquatics. I can be up on my feet for an hour before the pain increases.    PAIN:  Are you having pain? Yes: NPRS scale: 0/10 Pain location: Low back and upper gluteal, L > R Pain description: Muscle spasms,ache, throb, sharp Aggravating factors: prolonged standing and walking  Relieving factors: pain  and muscle relaxer meds, heating pad, sitting, lying down  0-10/10   OBJECTIVE: (objective measures completed at initial evaluation unless otherwise dated)   DIAGNOSTIC FINDINGS:   Xray 08/27/21 FINDINGS: Three C-arm images of the upper lumbar spine demonstrate bilateral pedicle screw placement at the L1 and L3 levels and right pedicle screw placement at the L2 level. Normal alignment.   IMPRESSION: Operative changes, as described above.   PATIENT SURVEYS:  LEFS 19/80   COGNITION:           Overall cognitive status: Within functional limits for tasks assessed                          SENSATION: Intact, but pt reports her L leg will become numb intermittently, esp with prolonged sitting   EDEMA:  Not observed/palpated of the legs   MUSCLE LENGTH: Hamstrings: Right 70 deg; Left 68 deg Thomas test: Right NT deg; Left NT deg   POSTURE: forward head; due to LS back brace , unable to assess thoracic and lumbar posture   PALPATION: Pt was not TTP of the lumbar paraspinals   LUMBAR ROM:  NT due to post surgical back restrictions Active  A/PROM  10/14/2021  Flexion    Extension    Right lateral flexion    Left lateral flexion    Right rotation    Left rotation     (Blank rows = not tested)   LOWER EXTREMITY ROM:                       Grosssly WNLS Active ROM Right eval Left eval  Hip flexion      Hip extension      Hip abduction      Hip adduction      Hip internal rotation      Hip external rotation      Knee flexion      Knee  extension      Ankle dorsiflexion      Ankle plantarflexion      Ankle inversion      Ankle eversion       (Blank rows = not tested)   LOWER EXTREMITY MMT:   MMT Right eval Left eval  Hip flexion 4+ 4+  Hip extension NT NT  Hip abduction 4 4  Hip adduction 5 5  Hip internal rotation 5 5  Hip external rotation 4+ 4+  Knee flexion 5 5  Knee extension 4+ 4+  Ankle dorsiflexion 5 5  Ankle plantarflexion 5 5  Ankle inversion      Ankle eversion       (Blank rows = not tested)   LUMBAR SPECIAL TESTS:  Straight leg raise test: Negative   LOWER EXTREMITY SPECIAL TESTS:  None completed   FUNCTIONAL TESTS:  5 times sit to stand: 24.5' from bariatric chair s use of hands 6 minute walk test: TBA when pt is able to walk without an assist device   GAIT: Distance walked: 200' Assistive device utilized: Environmental consultant - 4 wheeled Level of assistance: Modified independence Comments: Decreased pace, equall step length   TRANSFERS:                       -Ind with sit to stand transfer to mat table -Verbal instruction and CGA for sit to supine transfer c log rolling from side to supine. Pt returned to sitting from supine c verbal  cueing only. Pt reports tolerating the transfer and lying supine without increase in pain.     TODAY'S TREATMENT: [facility] Adult PT Treatment:                                                DATE: 10/25/21 Therapeutic Exercise: Nustep L 3 UE/LE x 5 min Heel/toe raises 10 x STS , cues for controled descent REd band clam Tra contract with bent knee raise, with march  Therapeutic Activity: Log rolling from side onto stomach -using 1 pillow to begin prone lying - avoid arching back per precautions   [facility] Adult PT Treatment:                                                DATE: 10/13/21 Therapeutic Exercise: - Sit to Stand with Arms Crossed 10 reps - 3 hold - Heel Toe Raises with Counter Support 10 reps - 3 hold - Hooklying Clamshell with Resistance BluTB 10 reps -  3 hold Self Care: Transfers and log rolling as above   PATIENT EDUCATION:  Education details: Eval findings, POC, HEP, self care Person educated: Patient and friend Education method: Explanation, Demonstration, Tactile cues, Verbal cues, and Handouts Education comprehension: verbalized understanding, returned demonstration, verbal cues required, and tactile cues required     HOME EXERCISE PROGRAM: Access Code: [email or web address] URL: [email or web address] Date: 10/14/2021 Prepared by: Gar Ponto   Exercises - Sit to Stand with Arms Crossed  - 1-2 x daily - 7 x weekly - 1-2 sets - 10 reps - 3 hold - Heel Toe Raises with Counter Support  - 1-2 x daily - 7 x weekly - 1-2 sets - 10 reps - 3 hold - Hooklying Clamshell with Resistance  - 1-2 x daily - 7 x weekly - 1-2 sets - 10 reps - 3 hold   ASSESSMENT:   CLINICAL IMPRESSION: Patient is a 31 y.o. female who was seen today for physical therapy treatment for Other fracture of second lumbar vertebra, subsequent encounter for fracture with routine healing following A LUMBAR ONE-TWO, LUMBAR TWO-THREE POSTERIOR INSTRUMENTED FUSION AND REDUCTION OF FRACTURE due to a fall from the 2nd floor in a stair well. Pt arrives with rollator and reports compliance with HEP. Reviewed HEP and answered questions about transfers to prone. Pt did well using 1 pillow under pelvis to begin. She felt decreased stiffness at end of session.  . OBJECTIVE IMPAIRMENTS decreased activity tolerance, decreased balance, decreased endurance, decreased mobility, difficulty walking, decreased ROM, decreased strength, impaired flexibility, and pain.    ACTIVITY LIMITATIONS carrying, lifting, bending, sitting, standing, squatting, sleeping, stairs, transfers, bed mobility, bathing, toileting, dressing, locomotion level, and caring for others   PARTICIPATION LIMITATIONS: meal prep, cleaning, laundry, driving, shopping, community activity, and school   PERSONAL FACTORS Time  since onset of injury/illness/exacerbation are also affecting patient's functional outcome.    REHAB POTENTIAL: Good   CLINICAL DECISION MAKING: Evolving/moderate complexity   EVALUATION COMPLEXITY: Moderate     GOALS:   SHORT TERM GOALS: Target date: 11/04/2021  Pt will be Ind in an initial HEP Baseline:initiated Goal status: INITIAL   2.  Pt will voice understanding of measures to assist in pain reduction Baseline: Instruction in bed mobility and  proper turning Goal status: INITIAL   LONG TERM GOALS: Target date: 12/16/21   Pt will be Ind in a final HEP to maintain achieved LOF Baseline:Initiated  Goal status: INITIAL   2.  Increase knee strength to 5/5 and hip strength to 4+/5 to optimize functional mobility Baseline: see flow sheets Goal status: INITIAL   3.  Pt will progress to Ind ambualtion with out an assist device on level/uneven surfaces and will ascend/descend steps indly in a reciprocal pattern and use of HR as needed Baseline: Needs a walker for ambulation and SBA on steps for safety  Goal status: INITIAL   4.  Improve 5xSTS by MCID of 5" and 2MWT by MCID of 13ft as indication of improved functional mobility  Baseline: 5xSTS 24.5 " s use of hands; 2MWT TBA when pt can walk without an AD Goal status: INITIAL   5.  Pt will demonstrate trunk ROM with only min limitations for optimize function Baseline: Restricted by LS brace Goal status: INITIAL     PLAN: PT FREQUENCY: 2x/week   PT DURATION: 8 weeks   PLANNED INTERVENTIONS: Therapeutic exercises, Therapeutic activity, Balance training, Gait training, Patient/Family education, Self Care, Aquatic Therapy, Dry Needling, Electrical stimulation, Cryotherapy, Moist heat, Taping, Manual therapy, and Re-evaluation   PLAN FOR NEXT SESSION: Review FOTO; assess response to HEP; progress therex as indicated; use of modalities, manual therapy; and TPDN as indicated. Pt would like aquatics once cleared by  MD    Hessie Diener, PTA 10/25/21 1:20 PM Phone: [phone] Fax: [phone]

## 2021-10-26 ENCOUNTER — Ambulatory Visit: Payer: Medicaid Other | Admitting: Physical Therapy

## 2021-10-27 ENCOUNTER — Ambulatory Visit: Payer: Medicaid Other | Admitting: Physical Therapy

## 2021-11-02 ENCOUNTER — Ambulatory Visit: Payer: Medicaid Other | Attending: Primary Care | Admitting: Physical Therapy

## 2021-11-02 ENCOUNTER — Ambulatory Visit: Payer: Medicaid Other | Admitting: Physical Therapy

## 2021-11-02 ENCOUNTER — Encounter: Payer: Self-pay | Admitting: Physical Therapy

## 2021-11-02 DIAGNOSIS — R262 Difficulty in walking, not elsewhere classified: Secondary | ICD-10-CM | POA: Diagnosis not present

## 2021-11-02 DIAGNOSIS — M5459 Other low back pain: Secondary | ICD-10-CM | POA: Diagnosis not present

## 2021-11-02 DIAGNOSIS — R252 Cramp and spasm: Secondary | ICD-10-CM | POA: Insufficient documentation

## 2021-11-02 DIAGNOSIS — M6281 Muscle weakness (generalized): Secondary | ICD-10-CM | POA: Diagnosis not present

## 2021-11-02 DIAGNOSIS — S32028D Other fracture of second lumbar vertebra, subsequent encounter for fracture with routine healing: Secondary | ICD-10-CM | POA: Diagnosis not present

## 2021-11-02 NOTE — Patient Instructions (Signed)
Please arrive 15 mins prior to your appointment to prepare.  Please note that it is approximately 400 feet from the nearest parking spot to the pool.  There are benches along the way but if you need assistance ambulating, please bring a support person as we cannot guarantee transportation assistance will be available.  Once you arrive inside the facility, please let the registration staff know you are there for aquatic therapy and they will direct you to the pool area.  Aquatic Therapy at [facility]-  What to Expect!  Where:   [facility] [place], [place] [address] Rehab phone [phone]  NOTE:  You will receive an automated phone message reminding you of your appt and it will say the appointment is at the [facility].          How to Prepare: Please make sure you drink 8 ounces of water about one hour prior to your pool session A caregiver may attend if needed with the patient to help assist as needed. A caregiver can sit in the pool room on chair. Please arrive IN YOUR SUIT and 15 minutes prior to your appointment - this helps to avoid delays in starting your session. Please make sure to attend to any toileting needs prior to entering the pool [place] rooms for changing are provided.   There is direct access to the pool deck form the locker room.  You can lock your belongings in a locker with lock provided. Once on the pool deck your therapist will ask if you have signed the Patient  Consent and Assignment of Benefits form before beginning treatment Your therapist may take your blood pressure prior to, during and after your session if indicated We usually try and create a home exercise program based on activities we do in the pool.  Please be thinking about who might be able to assist you in the pool should you need to participate in an aquatic home exercise program at the time of discharge if you need  assistance.  Some patients do not want to or do not have the ability to participate in an aquatic home program - this is not a barrier in any way to you participating in aquatic therapy as part of your current therapy plan! After Discharge from PT, you can continue using home program at  the [facility], there is a drop-in fee for $5 ($45 a month)or for 60 years  or older $4.00 ($40 a month for seniors ) or any local [facility] pool.  Memberships for purchase are available for gym/pool at [facility] LAST AQUATIC VISIT.  PLEASE MAKE SURE THAT YOU HAVE A [address]  APPOINTMENT SCHEDULED.   About the pool: Pool is located approximately 500 FT from the entrance of the building.  Please bring a support person if you need assistance traveling this      distance.   Your therapist will assist you in entering the water; there are two ways to           enter: stairs with railings, and a mechanical lift. Your therapist will determine the most appropriate way for you.  Water temperature is usually between 88-90 degrees  There may be up to 2 other swimmers in the pool at the same time  The pool deck is tile, please wear shoes with good traction  if you prefer not to be barefoot.    Contact Info:  For appointment scheduling and cancellations:         Please call the [facility]  PH:[phone]              Aquatic Therapy  Outpatient Rehabilitation @ [facility]       All sessions are 45 minutes

## 2021-11-02 NOTE — Therapy (Signed)
OUTPATIENT PHYSICAL THERAPY TREATMENT NOTE   Patient Name: Courtney Mendez MRN: [id number] DOB:Mar 05, 1990, 31 y.o., female Today's Date: 11/02/2021  PCP: Grayce Sessions, NP   REFERRING PROVIDER: Council Mechanic, NP  END OF SESSION:   PT End of Session - 11/02/21 1234     Visit Number 3    Number of Visits 17    Date for PT Re-Evaluation 12/16/21    Authorization Type [place] MEDICAID HEALTHY BLUE    Authorization Time Period 10/25/21-12/23/21    Authorization - Visit Number 2    Authorization - Number of Visits 7    Progress Note Due on Visit 10    PT Start Time 1232    PT Stop Time 1315    PT Time Calculation (min) 43 min             Past Medical History:  Diagnosis Date   Heart murmur    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   THERAPY DIAG:  Other low back pain  Cramp and spasm  Difficulty in walking, not elsewhere classified  Muscle weakness (generalized)  Rationale for Evaluation and Treatment Rehabilitation  PERTINENT HISTORY: Heart murmur   PRECAUTIONS: Back, no bending, arching, or lifting. Pt is wearing a thoracolumbar immobilization back brace which she can take off only to shower and when lying down. (11/02/21: saw MD - begin weaning from brace, ok for aquatics, no other updates, pt to call and clarify)  SUBJECTIVE:                                                                                                                                                                                      SUBJECTIVE STATEMENT:  I have been doing the exercises. The  doctor said I could wean the brace and I can start aquatic therapy. I saw him today.    PAIN:  Are you having pain? Yes: NPRS scale: 6/10 Pain location: Low back and upper gluteal, L > R Pain description: tension, tightness Aggravating factors: prolonged standing and walking  Relieving factors: pain and muscle relaxer meds, heating pad, sitting, lying down  0-10/10   OBJECTIVE: (objective measures completed at initial evaluation unless otherwise dated)   DIAGNOSTIC FINDINGS:   Xray 08/27/21 FINDINGS:  Three C-arm images of the upper lumbar spine demonstrate bilateral pedicle screw placement at the L1 and L3 levels and right pedicle screw placement at the L2 level. Normal alignment.   IMPRESSION: Operative changes, as described above.   PATIENT SURVEYS:  LEFS 19/80   COGNITION:           Overall cognitive status: Within functional limits for tasks assessed                          SENSATION: Intact, but pt reports her L leg will become numb intermittently, esp with prolonged sitting   EDEMA:  Not observed/palpated of the legs   MUSCLE LENGTH: Hamstrings: Right 70 deg; Left 68 deg Thomas test: Right NT deg; Left NT deg   POSTURE: forward head; due to LS back brace , unable to assess thoracic and lumbar posture   PALPATION: Pt was not TTP of the lumbar paraspinals   LUMBAR ROM:  NT due to post surgical back restrictions Active  A/PROM  10/14/2021  Flexion    Extension    Right lateral flexion    Left lateral flexion    Right rotation    Left rotation     (Blank rows = not tested)   LOWER EXTREMITY ROM:                       Grosssly WNLS Active ROM Right eval Left eval  Hip flexion      Hip extension      Hip abduction      Hip adduction      Hip internal rotation      Hip external rotation      Knee flexion      Knee extension      Ankle dorsiflexion      Ankle plantarflexion      Ankle inversion      Ankle eversion       (Blank rows = not  tested)   LOWER EXTREMITY MMT:   MMT Right eval Left eval  Hip flexion 4+ 4+  Hip extension NT NT  Hip abduction 4 4  Hip adduction 5 5  Hip internal rotation 5 5  Hip external rotation 4+ 4+  Knee flexion 5 5  Knee extension 4+ 4+  Ankle dorsiflexion 5 5  Ankle plantarflexion 5 5  Ankle inversion      Ankle eversion       (Blank rows = not tested)   LUMBAR SPECIAL TESTS:  Straight leg raise test: Negative   LOWER EXTREMITY SPECIAL TESTS:  None completed   FUNCTIONAL TESTS:  5 times sit to stand: 24.5' from bariatric chair s use of hands 6 minute walk test: TBA when pt is able to walk without an assist device   GAIT: Distance walked: 200' Assistive device utilized: Environmental consultant - 4 wheeled Level of assistance: Modified independence Comments: Decreased pace, equall step length   TRANSFERS:                       -Ind with sit to stand transfer to mat table -Verbal instruction and CGA for sit to supine transfer c log rolling from side to supine. Pt returned to sitting from supine c verbal cueing only. Pt reports tolerating the transfer and lying supine without increase in pain.     TODAY'S TREATMENT: [facility] Adult PT Treatment:  DATE: 11/02/21 Brace removed for treatment Therapeutic Exercise: Nustep L4 UE/LE x 6 min STS x  12 Without UE Supine A/P gentle pelvic tilts  Gentle LTR Gentle SKTC Supine March Supine clam with blue band  Scheduled for aquatic therapy, education on attendance policy reviewed and pt verbalized understanding.     [facility] Adult PT Treatment:                                                DATE: 10/25/21 Therapeutic Exercise: Nustep L 3 UE/LE x 5 min Heel/toe raises 10 x STS , cues for controled descent REd band clam Tra contract with bent knee raise, with march  Therapeutic Activity: Log rolling from side onto stomach -using 1 pillow to begin prone lying - avoid arching back per  precautions   [facility] Adult PT Treatment:                                                DATE: 10/13/21 Therapeutic Exercise: - Sit to Stand with Arms Crossed 10 reps - 3 hold - Heel Toe Raises with Counter Support 10 reps - 3 hold - Hooklying Clamshell with Resistance BluTB 10 reps - 3 hold Self Care: Transfers and log rolling as above   PATIENT EDUCATION:  Education details: Eval findings, POC, HEP, self care Person educated: Patient and friend Education method: Explanation, Demonstration, Tactile cues, Verbal cues, and Handouts Education comprehension: verbalized understanding, returned demonstration, verbal cues required, and tactile cues required     HOME EXERCISE PROGRAM: Access Code: [email or web address] URL: [email or web address] Date: 10/14/2021 Prepared by: Joellyn Rued   Exercises - Sit to Stand with Arms Crossed  - 1-2 x daily - 7 x weekly - 1-2 sets - 10 reps - 3 hold - Heel Toe Raises with Counter Support  - 1-2 x daily - 7 x weekly - 1-2 sets - 10 reps - 3 hold - Hooklying Clamshell with Resistance  - 1-2 x daily - 7 x weekly - 1-2 sets - 10 reps - 3 hold   ASSESSMENT:   CLINICAL IMPRESSION: Patient is a 31 y.o. female who was seen today for physical therapy treatment for Other fracture of second lumbar vertebra, subsequent encounter for fracture with routine healing following A LUMBAR ONE-TWO, LUMBAR TWO-THREE POSTERIOR INSTRUMENTED FUSION AND REDUCTION OF FRACTURE due to a fall from the 2nd floor in a stair well. Pt arrives without rollator and reports compliance with HEP. She had s follow up today with MD who is allowing her to wean from TLSO and she can start aquatics. She did not get an update on her bending, lifting, twisting precautions and plans to call the office to get clarification.  Reviewed HEP and progressed with gentle supine trunk ROM. Pt was scheduled for aquatics starting next week and understands the attendance policy and implications if she has another  no-show.   . OBJECTIVE IMPAIRMENTS decreased activity tolerance, decreased balance, decreased endurance, decreased mobility, difficulty walking, decreased ROM, decreased strength, impaired flexibility, and pain.    ACTIVITY LIMITATIONS carrying, lifting, bending, sitting, standing, squatting, sleeping, stairs, transfers, bed mobility, bathing, toileting, dressing, locomotion level, and caring for others   PARTICIPATION LIMITATIONS: meal prep, cleaning, laundry, driving, shopping, community activity, and  school   PERSONAL FACTORS Time since onset of injury/illness/exacerbation are also affecting patient's functional outcome.    REHAB POTENTIAL: Good   CLINICAL DECISION MAKING: Evolving/moderate complexity   EVALUATION COMPLEXITY: Moderate     GOALS:   SHORT TERM GOALS: Target date: 11/04/2021  Pt will be Ind in an initial HEP Baseline:initiated Goal status: INITIAL   2.  Pt will voice understanding of measures to assist in pain reduction Baseline: Instruction in bed mobility and proper turning Goal status: INITIAL   LONG TERM GOALS: Target date: 12/16/21   Pt will be Ind in a final HEP to maintain achieved LOF Baseline:Initiated  Goal status: INITIAL   2.  Increase knee strength to 5/5 and hip strength to 4+/5 to optimize functional mobility Baseline: see flow sheets Goal status: INITIAL   3.  Pt will progress to Ind ambualtion with out an assist device on level/uneven surfaces and will ascend/descend steps indly in a reciprocal pattern and use of HR as needed Baseline: Needs a walker for ambulation and SBA on steps for safety  Goal status: INITIAL   4.  Improve 5xSTS by MCID of 5" and by MCID of 20ft as indication of improved functional mobility  Baseline: 5xSTS 24.5 " s use of hands; TBA when pt can walk without an AD Goal status: INITIAL   5.  Pt will demonstrate trunk ROM with only min limitations for optimize function Baseline: Restricted by LS brace Goal  status: INITIAL     PLAN: PT FREQUENCY: 2x/week   PT DURATION: 8 weeks   PLANNED INTERVENTIONS: Therapeutic exercises, Therapeutic activity, Balance training, Gait training, Patient/Family education, Self Care, Aquatic Therapy, Dry Needling, Electrical stimulation, Cryotherapy, Moist heat, Taping, Manual therapy, and Re-evaluation   PLAN FOR NEXT SESSION:  assess response to HEP; progress therex as indicated; use of modalities, manual therapy; and TPDN as indicated. Did she get any updates on her restrictions? She is scheduled for aquatics next week    Jannette Spanner, PTA 11/02/21 1:34 PM Phone: [phone] Fax: [phone]

## 2021-11-03 NOTE — Therapy (Signed)
OUTPATIENT PHYSICAL THERAPY TREATMENT NOTE   Patient Name: Courtney Mendez MRN: [id number] DOB:07/12/1990, 31 y.o., female Today's Date: 11/04/2021  PCP: Grayce Sessions, NP   REFERRING PROVIDER: Council Mechanic, NP  END OF SESSION:   PT End of Session - 11/04/21 1533     Visit Number 4    Number of Visits 17    Date for PT Re-Evaluation 12/16/21    Authorization Type [place] MEDICAID HEALTHY BLUE    Authorization Time Period Approved 7 PT visits from 10/25/2021-12/23/2021    Authorization - Visit Number 3    Authorization - Number of Visits 7    Progress Note Due on Visit 10    PT Start Time 1235    PT Stop Time 1320    PT Time Calculation (min) 45 min    Equipment Utilized During Treatment Back brace    Activity Tolerance Patient tolerated treatment well    Behavior During Therapy WFL for tasks assessed/performed             Past Medical History:  Diagnosis Date   Heart murmur    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   THERAPY DIAG:  Other low back pain  Difficulty in walking, not elsewhere classified  Muscle weakness (generalized)  Rationale for Evaluation and Treatment Rehabilitation  PERTINENT HISTORY: Heart murmur   PRECAUTIONS: Back, no bending, arching, or lifting. Pt is wearing a thoracolumbar immobilization back brace which she can take off only to shower and when lying down. (11/02/21: saw MD - begin weaning from brace, ok for aquatics, no other updates, pt to call and clarify)  SUBJECTIVE:                                                                                                                                                                                       SUBJECTIVE STATEMENT:  I not having much pain just soreness of her muscles. She notes the muscles of L back tend to be tighter   PAIN:  Are you having pain? Yes: NPRS scale: 6/10 Pain location: Low back and upper gluteal, L > R Pain description: tension, tightness Aggravating factors: prolonged standing and walking  Relieving factors: pain and muscle relaxer meds, heating pad, sitting,  lying down  0-10/10   OBJECTIVE: (objective measures completed at initial evaluation unless otherwise dated)   DIAGNOSTIC FINDINGS:   Xray 08/27/21 FINDINGS: Three C-arm images of the upper lumbar spine demonstrate bilateral pedicle screw placement at the L1 and L3 levels and right pedicle screw placement at the L2 level. Normal alignment.   IMPRESSION: Operative changes, as described above.   PATIENT SURVEYS:  LEFS 19/80   COGNITION:           Overall cognitive status: Within functional limits for tasks assessed                          SENSATION: Intact, but pt reports her L leg will become numb intermittently, esp with prolonged sitting   EDEMA:  Not observed/palpated of the legs   MUSCLE LENGTH: Hamstrings: Right 70 deg; Left 68 deg Thomas test: Right NT deg; Left NT deg   POSTURE: forward head; due to LS back brace , unable to assess thoracic and lumbar posture   PALPATION: Pt was not TTP of the lumbar paraspinals   LUMBAR ROM:  NT due to post surgical back restrictions Active  A/PROM  10/14/2021  Flexion    Extension    Right lateral flexion    Left lateral flexion    Right rotation    Left rotation     (Blank rows = not tested)   LOWER EXTREMITY ROM:                       Grosssly WNLS Active ROM Right eval Left eval  Hip flexion      Hip extension      Hip abduction      Hip adduction      Hip internal rotation      Hip external rotation       Knee flexion      Knee extension      Ankle dorsiflexion      Ankle plantarflexion      Ankle inversion      Ankle eversion       (Blank rows = not tested)   LOWER EXTREMITY MMT:   MMT Right eval Left eval  Hip flexion 4+ 4+  Hip extension NT NT  Hip abduction 4 4  Hip adduction 5 5  Hip internal rotation 5 5  Hip external rotation 4+ 4+  Knee flexion 5 5  Knee extension 4+ 4+  Ankle dorsiflexion 5 5  Ankle plantarflexion 5 5  Ankle inversion      Ankle eversion       (Blank rows = not tested)   LUMBAR SPECIAL TESTS:  Straight leg raise test: Negative   LOWER EXTREMITY SPECIAL TESTS:  None completed   FUNCTIONAL TESTS:  5 times sit to stand: 24.5' from bariatric chair s use of hands 6 minute walk test: TBA when pt is able to walk without an assist device   GAIT: Distance walked: 200' Assistive device utilized: Environmental consultant - 4 wheeled Level of assistance: Modified independence Comments: Decreased pace, equall step length   TRANSFERS:                       -Ind with sit to stand transfer to mat table -Verbal instruction and CGA for sit to supine transfer c log rolling from side to supine. Pt returned to sitting from supine c verbal cueing only. Pt reports tolerating the transfer  and lying supine without increase in pain.     TODAY'S TREATMENT: [facility] Adult PT Treatment:                                                DATE: 11/04/21 Therapeutic Exercise: Nustep L5 UE/LE x 6 min Standing shoulder row 2x15 GTB Standing shoulder ext 2x15 GTB STS c arm lifts 10# 2x10  Omega knee ext bilat 3x10 10# Updated HEP  [facility] Adult PT Treatment:                                                DATE: 11/02/21 Brace removed for treatment Therapeutic Exercise: Nustep L4 UE/LE x 6 min STS x  12 Without UE Supine A/P gentle pelvic tilts  Gentle LTR Gentle SKTC Supine March Supine clam with blue band  Scheduled for aquatic therapy, education on attendance policy reviewed and pt  verbalized understanding.     [facility] Adult PT Treatment:                                                DATE: 10/25/21 Therapeutic Exercise: Nustep L 3 UE/LE x 5 min Heel/toe raises 10 x STS , cues for controled descent REd band clam Tra contract with bent knee raise, with march  Therapeutic Activity: Log rolling from side onto stomach -using 1 pillow to begin prone lying - avoid arching back per precautions   [facility] Adult PT Treatment:                                                DATE: 10/13/21 Therapeutic Exercise: - Sit to Stand with Arms Crossed 10 reps - 3 hold - Heel Toe Raises with Counter Support 10 reps - 3 hold - Hooklying Clamshell with Resistance BluTB 10 reps - 3 hold Self Care: Transfers and log rolling as above   PATIENT EDUCATION:  Education details: Eval findings, POC, HEP, self care Person educated: Patient and friend Education method: Explanation, Demonstration, Tactile cues, Verbal cues, and Handouts Education comprehension: verbalized understanding, returned demonstration, verbal cues required, and tactile cues required     HOME EXERCISE PROGRAM: Access Code: [email or web address] URL: [email or web address] Date: 11/04/2021 Prepared by: Joellyn Rued  Exercises - Sit to Stand with Arms Crossed  - 1-2 x daily - 7 x weekly - 1-2 sets - 10 reps - 3 hold - Heel Toe Raises with Counter Support  - 1-2 x daily - 7 x weekly - 1-2 sets - 10 reps - 3 hold - Hooklying Clamshell with Resistance  - 1-2 x daily - 7 x weekly - 1-2 sets - 10 reps - 3 hold - Standing Shoulder Row with Anchored Resistance  - 1 x daily - 7 x weekly - 3 sets - 10 reps - 3 hold - Shoulder extension with resistance - Neutral  - 1 x daily - 7 x weekly - 3 sets - 10 reps - 3 hold   ASSESSMENT:  CLINICAL IMPRESSION: PT was completed for lumbopelvic/upper and lower body strengthening. Therex were completed with reminders for maintaining proper trunk posture. Pt tolerated strengthening with  greater demand without adverse effects. Pt's HEP was updated. Pt presented to PT without the RW for assistance, and PT was completed out of the LS back back. Pt is making appropriate progress re; functional mobility and tolerance to activity. Pt will continue to benefit from skilled PT to address impairments for improved function.  OBJECTIVE IMPAIRMENTS decreased activity tolerance, decreased balance, decreased endurance, decreased mobility, difficulty walking, decreased ROM, decreased strength, impaired flexibility, and pain.    ACTIVITY LIMITATIONS carrying, lifting, bending, sitting, standing, squatting, sleeping, stairs, transfers, bed mobility, bathing, toileting, dressing, locomotion level, and caring for others   PARTICIPATION LIMITATIONS: meal prep, cleaning, laundry, driving, shopping, community activity, and school   PERSONAL FACTORS Time since onset of injury/illness/exacerbation are also affecting patient's functional outcome.    REHAB POTENTIAL: Good   CLINICAL DECISION MAKING: Evolving/moderate complexity   EVALUATION COMPLEXITY: Moderate     GOALS:   SHORT TERM GOALS: Target date: 11/04/2021  Pt will be Ind in an initial HEP Baseline:initiated Goal status: Appropriate understanding for current program   2.  Pt will voice understanding of measures to assist in pain reduction Baseline: Instruction in bed mobility and proper turning Goal status: INITIAL   LONG TERM GOALS: Target date: 12/16/21   Pt will be Ind in a final HEP to maintain achieved LOF Baseline:Initiated  Goal status: INITIAL   2.  Increase knee strength to 5/5 and hip strength to 4+/5 to optimize functional mobility Baseline: see flow sheets Goal status: INITIAL   3.  Pt will progress to Ind ambualtion with out an assist device on level/uneven surfaces and will ascend/descend steps indly in a reciprocal pattern and use of HR as needed Baseline: Needs a walker for ambulation and SBA on steps for safety   Goal status: INITIAL   4.  Improve 5xSTS by MCID of 5" and by MCID of 13ft as indication of improved functional mobility  Baseline: 5xSTS 24.5 " s use of hands; TBA when pt can walk without an AD Goal status: INITIAL   5.  Pt will demonstrate trunk ROM with only min limitations for optimize function Baseline: Restricted by LS brace Goal status: INITIAL     PLAN: PT FREQUENCY: 2x/week   PT DURATION: 8 weeks   PLANNED INTERVENTIONS: Therapeutic exercises, Therapeutic activity, Balance training, Gait training, Patient/Family education, Self Care, Aquatic Therapy, Dry Needling, Electrical stimulation, Cryotherapy, Moist heat, Taping, Manual therapy, and Re-evaluation   PLAN FOR NEXT SESSION:  assess response to HEP; progress therex as indicated; use of modalities, manual therapy; and TPDN as indicated. Did she get any updates on her restrictions? She is scheduled for aquatics next week. Complete 2WMT.  Orvile Corona MS, PT 11/04/21 3:58 PM

## 2021-11-04 ENCOUNTER — Ambulatory Visit: Payer: Medicaid Other

## 2021-11-04 ENCOUNTER — Encounter: Payer: Medicaid Other | Admitting: Physical Therapy

## 2021-11-04 DIAGNOSIS — M5459 Other low back pain: Secondary | ICD-10-CM | POA: Diagnosis not present

## 2021-11-04 DIAGNOSIS — R252 Cramp and spasm: Secondary | ICD-10-CM | POA: Diagnosis not present

## 2021-11-04 DIAGNOSIS — R262 Difficulty in walking, not elsewhere classified: Secondary | ICD-10-CM | POA: Diagnosis not present

## 2021-11-04 DIAGNOSIS — M6281 Muscle weakness (generalized): Secondary | ICD-10-CM | POA: Diagnosis not present

## 2021-11-07 ENCOUNTER — Encounter: Payer: Medicaid Other | Admitting: Physical Therapy

## 2021-11-08 ENCOUNTER — Ambulatory Visit: Payer: Medicaid Other

## 2021-11-08 DIAGNOSIS — R252 Cramp and spasm: Secondary | ICD-10-CM | POA: Diagnosis not present

## 2021-11-08 DIAGNOSIS — R262 Difficulty in walking, not elsewhere classified: Secondary | ICD-10-CM | POA: Diagnosis not present

## 2021-11-08 DIAGNOSIS — M5459 Other low back pain: Secondary | ICD-10-CM

## 2021-11-08 DIAGNOSIS — M6281 Muscle weakness (generalized): Secondary | ICD-10-CM

## 2021-11-08 NOTE — Therapy (Signed)
OUTPATIENT PHYSICAL THERAPY TREATMENT NOTE   Patient Name: Courtney Mendez MRN: [id number] DOB:Dec 21, 1990, 31 y.o., female Today's Date: 11/08/2021  PCP: Kerin Perna, NP   REFERRING PROVIDER: Marvis Moeller, NP  END OF SESSION:   PT End of Session - 11/08/21 1648     Visit Number 5    Number of Visits 17    Date for PT Re-Evaluation 12/16/21    Authorization Type [place] MEDICAID HEALTHY BLUE    Authorization Time Period Approved 7 PT visits from 10/25/2021-12/23/2021    Authorization - Visit Number 4    Authorization - Number of Visits 7    Progress Note Due on Visit 10    PT Start Time 1640    PT Stop Time 1725    PT Time Calculation (min) 45 min    Equipment Utilized During Treatment Back brace    Activity Tolerance Patient tolerated treatment well    Behavior During Therapy WFL for tasks assessed/performed             Past Medical History:  Diagnosis Date   Heart murmur    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   THERAPY DIAG:  Other low back pain  Difficulty in walking, not elsewhere classified  Muscle weakness (generalized)  Cramp and spasm  Rationale for Evaluation and Treatment Rehabilitation  PERTINENT HISTORY: Heart murmur   PRECAUTIONS: Back, no bending, arching, or lifting. Pt is wearing a thoracolumbar immobilization back brace which she can take off only to shower and when lying down. (11/02/21: saw MD - begin weaning from brace, ok for aquatics, no other updates, pt to call and clarify)  SUBJECTIVE:                                                                                                                                                                                       SUBJECTIVE STATEMENT:  Pt reports she has been completing her HEP and tolerating it well.   PAIN:  Are you having pain? Yes: NPRS scale: 4/10 Pain location: Low back and upper gluteal, L > R Pain description: tension, tightness Aggravating factors: prolonged standing and walking  Relieving factors: pain and muscle relaxer meds, heating pad, sitting, lying down  0-10/10  OBJECTIVE: (objective measures completed at initial evaluation unless otherwise dated)   DIAGNOSTIC FINDINGS:   Xray 08/27/21 FINDINGS: Three C-arm images of the upper lumbar spine demonstrate bilateral pedicle screw placement at the L1 and L3 levels and right pedicle screw placement at the L2 level. Normal alignment.   IMPRESSION: Operative changes, as described above.   PATIENT SURVEYS:  LEFS 19/80   COGNITION:           Overall cognitive status: Within functional limits for tasks assessed                          SENSATION: Intact, but pt reports her L leg will become numb intermittently, esp with prolonged sitting   EDEMA:  Not observed/palpated of the legs   MUSCLE LENGTH: Hamstrings: Right 70 deg; Left 68 deg Thomas test: Right NT deg; Left NT deg   POSTURE: forward head; due to LS back brace , unable to assess thoracic and lumbar posture   PALPATION: Pt was not TTP of the lumbar paraspinals   LUMBAR ROM:  NT due to post surgical back restrictions Active  A/PROM  10/14/2021  Flexion    Extension    Right lateral flexion    Left lateral flexion    Right rotation    Left rotation     (Blank rows = not tested)   LOWER EXTREMITY ROM:                       Grosssly WNLS Active ROM Right eval Left eval  Hip flexion      Hip extension      Hip abduction      Hip adduction      Hip internal rotation      Hip external rotation      Knee flexion       Knee extension      Ankle dorsiflexion      Ankle plantarflexion      Ankle inversion      Ankle eversion       (Blank rows = not tested)   LOWER EXTREMITY MMT:   MMT Right eval Left eval  Hip flexion 4+ 4+  Hip extension NT NT  Hip abduction 4 4  Hip adduction 5 5  Hip internal rotation 5 5  Hip external rotation 4+ 4+  Knee flexion 5 5  Knee extension 4+ 4+  Ankle dorsiflexion 5 5  Ankle plantarflexion 5 5  Ankle inversion      Ankle eversion       (Blank rows = not tested)   LUMBAR SPECIAL TESTS:  Straight leg raise test: Negative   LOWER EXTREMITY SPECIAL TESTS:  None completed   FUNCTIONAL TESTS:  5 times sit to stand: 24.5' from bariatric chair s use of hands 6 minute walk test: TBA when pt is able to walk without an assist device   GAIT: Distance walked: 200' Assistive device utilized: Environmental consultant - 4 wheeled Level of assistance: Modified independence Comments: Decreased pace, equall step length   TRANSFERS:                       -Ind with sit to stand transfer to mat table -Verbal instruction and CGA for sit to supine transfer c log rolling from side to supine. Pt returned to sitting from supine c verbal cueing only. Pt reports tolerating the transfer and lying supine without increase in  pain.     TODAY'S TREATMENT: [facility] Adult PT Treatment:                                                DATE: 11/08/21 Therapeutic Exercise: Nustep L5 UE/LE x 6 min Standing shoulder row 2x15 GTB Standing shoulder ext 2x15 GTB Paloff side steps c RTB x10 each Paloff  press c RTB x15 each Gentle LTR bilat Gentle open book  bilat Piriformis stretch 20" bilat Posterior pelvic tilt x15 3" 90/90 abd heel taps x10 each Standing QL stretch 2x 20" Updated HEP  [facility] Adult PT Treatment:                                                DATE: 11/04/21 Therapeutic Exercise: Nustep L5 UE/LE x 6 min Standing shoulder row 2x15 GTB Standing shoulder ext 2x15 GTB STS c arm lifts 10#  2x10  Omega knee ext bilat 3x10 10# Updated HEP  [facility] Adult PT Treatment:                                                DATE: 11/02/21 Brace removed for treatment Therapeutic Exercise: Nustep L4 UE/LE x 6 min STS x  12 Without UE Supine A/P gentle pelvic tilts  Gentle LTR Gentle SKTC Supine March Supine clam with blue band  Scheduled for aquatic therapy, education on attendance policy reviewed and pt verbalized understanding.   PATIENT EDUCATION:  Education details: Eval findings, POC, HEP, self care Person educated: Patient and friend Education method: Explanation, Demonstration, Tactile cues, Verbal cues, and Handouts Education comprehension: verbalized understanding, returned demonstration, verbal cues required, and tactile cues required     HOME EXERCISE PROGRAM:   Access Code: [email or web address] URL: [email or web address] Date: 11/08/2021 Prepared by: Gar Ponto  Exercises - Sit to Stand with Arms Crossed  - 1-2 x daily - 7 x weekly - 1-2 sets - 10 reps - 3 hold - Heel Toe Raises with Counter Support  - 1-2 x daily - 7 x weekly - 1-2 sets - 10 reps - 3 hold - Hooklying Clamshell with Resistance  - 1-2 x daily - 7 x weekly - 1-2 sets - 10 reps - 3 hold - Standing Shoulder Row with Anchored Resistance  - 1 x daily - 7 x weekly - 3 sets - 10 reps - 3 hold - Shoulder extension with resistance - Neutral  - 1 x daily - 7 x weekly - 3 sets - 10 reps - 3 hold - Anti-Rotation Lateral Stepping with Press  - 1 x daily - 7 x weekly - 2 sets - 10 reps - 3 hold - Anti-Rotation Sidestepping with Resistance  - 1 x daily - 7 x weekly - 2 sets - 10 reps - 3 hold - Supine Posterior Pelvic Tilt  - 1 x daily - 7 x weekly - 1 sets - 10 reps - 5 hold - Supine 90/90 Alternating Heel Touches with Posterior Pelvic Tilt  - 1 x daily - 7 x weekly - 2 sets - 10 reps - 2 hold - Supine Lower  Trunk Rotation  - 1 x daily - 7 x weekly - 1 sets - 10 reps - 5 hold - Sidelying Thoracic Rotation with  Open Book  - 1 x daily - 7 x weekly - 1 sets - 10 reps - 5 hold - Supine Piriformis Stretch with Foot on Ground  - 1 x daily - 7 x weekly - 1 sets - 3 reps - 20 hold - Standing Quadratus Lumborum Stretch with Doorway  - 1 x daily - 7 x weekly - 1 sets - 3 reps - 20 hold    ASSESSMENT:   CLINICAL IMPRESSION: PT was completed for lumbopelvic/upper and lower body strengthening and flexibility. Gentle flexibility and core strengthening exs were added and the HEP was updated. Therex were completed with the LS brace off and with reminders for maintaining proper trunk posture. A gradual increase in demand for strengthening therex was tolerated without adverse effects. Pt has MET STGs. Pt will continue to benefit from skilled PT to address impairments for improved function.  OBJECTIVE IMPAIRMENTS decreased activity tolerance, decreased balance, decreased endurance, decreased mobility, difficulty walking, decreased ROM, decreased strength, impaired flexibility, and pain.    ACTIVITY LIMITATIONS carrying, lifting, bending, sitting, standing, squatting, sleeping, stairs, transfers, bed mobility, bathing, toileting, dressing, locomotion level, and caring for others   PARTICIPATION LIMITATIONS: meal prep, cleaning, laundry, driving, shopping, community activity, and school   PERSONAL FACTORS Time since onset of injury/illness/exacerbation are also affecting patient's functional outcome.    REHAB POTENTIAL: Good   CLINICAL DECISION MAKING: Evolving/moderate complexity   EVALUATION COMPLEXITY: Moderate     GOALS:   SHORT TERM GOALS: Target date: 11/04/2021  Pt will be Ind in an initial HEP Baseline:initiated Goal status: MET for current HEP 11/08/21   2.  Pt will voice understanding of measures to assist in pain reduction Baseline: Instruction in bed mobility and proper turning Status: 11/08/21=Use ot therex helps to decrease pain and tightness Goal status: MET   LONG TERM GOALS: Target date:  12/16/21   Pt will be Ind in a final HEP to maintain achieved LOF Baseline:Initiated  Goal status: INITIAL   2.  Increase knee strength to 5/5 and hip strength to 4+/5 to optimize functional mobility Baseline: see flow sheets Goal status: INITIAL   3.  Pt will progress to Ind ambualtion with out an assist device on level/uneven surfaces and will ascend/descend steps indly in a reciprocal pattern and use of HR as needed Baseline: Needs a walker for ambulation and SBA on steps for safety  Goal status: INITIAL   4.  Improve 5xSTS by MCID of 5" and 2MWT by MCID of 62f as indication of improved functional mobility  Baseline: 5xSTS 24.5 " s use of hands; 2MWT TBA when pt can walk without an AD Goal status: INITIAL   5.  Pt will demonstrate trunk ROM with only min limitations for optimize function Baseline: Restricted by LS brace Goal status: INITIAL     PLAN: PT FREQUENCY: 2x/week   PT DURATION: 8 weeks   PLANNED INTERVENTIONS: Therapeutic exercises, Therapeutic activity, Balance training, Gait training, Patient/Family education, Self Care, Aquatic Therapy, Dry Needling, Electrical stimulation, Cryotherapy, Moist heat, Taping, Manual therapy, and Re-evaluation   PLAN FOR NEXT SESSION:  assess response to HEP; progress therex as indicated; use of modalities, manual therapy; and TPDN as indicated. Did she get any updates on her restrictions? She is scheduled for aquatics next week. Complete 2WMT.  Milina Pagett MS, PT 11/08/21 5:56 PM

## 2021-11-09 ENCOUNTER — Encounter: Payer: Medicaid Other | Admitting: Physical Therapy

## 2021-11-10 ENCOUNTER — Encounter: Payer: Self-pay | Admitting: Physical Therapy

## 2021-11-10 ENCOUNTER — Ambulatory Visit: Payer: Medicaid Other | Admitting: Physical Therapy

## 2021-11-10 DIAGNOSIS — M6281 Muscle weakness (generalized): Secondary | ICD-10-CM | POA: Diagnosis not present

## 2021-11-10 DIAGNOSIS — M5459 Other low back pain: Secondary | ICD-10-CM | POA: Diagnosis not present

## 2021-11-10 DIAGNOSIS — R262 Difficulty in walking, not elsewhere classified: Secondary | ICD-10-CM

## 2021-11-10 DIAGNOSIS — R252 Cramp and spasm: Secondary | ICD-10-CM

## 2021-11-10 NOTE — Therapy (Signed)
OUTPATIENT PHYSICAL THERAPY TREATMENT NOTE   Patient Name: Courtney Mendez MRN: [id number] DOB:1990-07-18, 31 y.o., female Today's Date: 11/11/2021  PCP: Kerin Perna, NP   REFERRING PROVIDER: Marvis Moeller, NP  END OF SESSION:   PT End of Session - 11/10/21 1613     Visit Number 6    Number of Visits 17    Date for PT Re-Evaluation 12/16/21    Authorization Type [place] MEDICAID HEALTHY BLUE    Authorization Time Period Approved 7 PT visits from 10/25/2021-12/23/2021    Authorization - Visit Number 6    Authorization - Number of Visits 7    Progress Note Due on Visit 10    PT Start Time 0415    PT Stop Time 0456    PT Time Calculation (min) 41 min    Equipment Utilized During Treatment Back brace    Activity Tolerance Patient tolerated treatment well    Behavior During Therapy WFL for tasks assessed/performed             Past Medical History:  Diagnosis Date   Heart murmur    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   THERAPY DIAG:  Other low back pain  Difficulty in walking, not elsewhere classified  Muscle weakness (generalized)  Cramp and spasm  Rationale for Evaluation and Treatment Rehabilitation  PERTINENT HISTORY: Heart murmur   PRECAUTIONS: Back, no bending, arching, or lifting. Pt is wearing a thoracolumbar immobilization back brace which she can take off only to shower and when lying down. (11/02/21: saw MD - begin weaning from brace, ok for aquatics, no other updates, pt to call and clarify)  SUBJECTIVE:                                                                                                                                                                                       SUBJECTIVE STATEMENT:  Pt states that she is doing well overall.  She still has some continued soreness in her low back and hips L>R   PAIN:  Are you having pain? Yes: NPRS scale: 4-5/10 Pain location: Low back and upper gluteal, L > R Pain description: tension, tightness Aggravating factors: prolonged standing and walking  Relieving factors: pain and muscle  relaxer meds, heating pad, sitting, lying down  0-10/10   OBJECTIVE: (objective measures completed at initial evaluation unless otherwise dated)   DIAGNOSTIC FINDINGS:   Xray 08/27/21 FINDINGS: Three C-arm images of the upper lumbar spine demonstrate bilateral pedicle screw placement at the L1 and L3 levels and right pedicle screw placement at the L2 level. Normal alignment.   IMPRESSION: Operative changes, as described above.   PATIENT SURVEYS:  LEFS 19/80   COGNITION:           Overall cognitive status: Within functional limits for tasks assessed                          SENSATION: Intact, but pt reports her L leg will become numb intermittently, esp with prolonged sitting   EDEMA:  Not observed/palpated of the legs   MUSCLE LENGTH: Hamstrings: Right 70 deg; Left 68 deg Thomas test: Right NT deg; Left NT deg   POSTURE: forward head; due to LS back brace , unable to assess thoracic and lumbar posture   PALPATION: Pt was not TTP of the lumbar paraspinals   LUMBAR ROM:  NT due to post surgical back restrictions Active  A/PROM  10/14/2021  Flexion    Extension    Right lateral flexion    Left lateral flexion    Right rotation    Left rotation     (Blank rows = not tested)   LOWER EXTREMITY ROM:                       Grosssly WNLS Active ROM Right eval Left eval  Hip flexion      Hip extension      Hip abduction      Hip adduction      Hip internal rotation       Hip external rotation      Knee flexion      Knee extension      Ankle dorsiflexion      Ankle plantarflexion      Ankle inversion      Ankle eversion       (Blank rows = not tested)   LOWER EXTREMITY MMT:   MMT Right eval Left eval  Hip flexion 4+ 4+  Hip extension NT NT  Hip abduction 4 4  Hip adduction 5 5  Hip internal rotation 5 5  Hip external rotation 4+ 4+  Knee flexion 5 5  Knee extension 4+ 4+  Ankle dorsiflexion 5 5  Ankle plantarflexion 5 5  Ankle inversion      Ankle eversion       (Blank rows = not tested)   LUMBAR SPECIAL TESTS:  Straight leg raise test: Negative   LOWER EXTREMITY SPECIAL TESTS:  None completed   FUNCTIONAL TESTS:  5 times sit to stand: 24.5' from bariatric chair s use of hands 6 minute walk test: TBA when pt is able to walk without an assist device   GAIT: Distance walked: 200' Assistive device utilized: Environmental consultant - 4 wheeled Level of assistance: Modified independence Comments: Decreased pace, equall step length   TRANSFERS:                       -Ind with sit to stand transfer to mat table -Verbal instruction and CGA for sit to supine transfer c log rolling from side to supine. Pt returned to sitting from supine c verbal cueing only.  Pt reports tolerating the transfer and lying supine without increase in pain.     TODAY'S TREATMENT:  TREATMENT 11/10/21:  Aquatic therapy at [address] - therapeutic pool temp 92 degrees Pt enters building independently.  Treatment took place in water 3.8 to  4 ft 8 in.feet deep depending upon activity.  Pt entered and exited the pool via stair and handrails    Aquatic Therapy:  Water walking for warm up  Stretching: Runners stretch on bottom step x30" BIL Hamstring stretch on bottom step x30" BIL Figure 4 squat stretch, BIL UE support 2x30" BIL (NT)  At edge of pool, pt performed LE exercise: Standing march x20 Walking march 2 laps Hamstring curl x20 BIL Shoulder  abd then add adduction with rainbow DB with abdominal bracing - 20x Kickboard push pull with AB - 20x Hip abd/add x20 BIL Squats x20 Step ups fwd and lat on submerged step 2x10 BIL  Sitting on bench in water: Bicycle kicks x1' Flutter kicks x1' Scissor kicks x1'  Pt requires the buoyancy of water for active assisted exercises with buoyancy supported for strengthening and AROM exercises. Hydrostatic pressure also supports joints by unweighting joint load by at least 50 % in 3-4 feet depth water. 80% in chest to neck deep water. Water will provide assistance with movement using the current and laminar flow while the buoyancy reduces weight bearing. Pt requires the viscosity of the water for resistance with strengthening exercises.   [facility] Adult PT Treatment:                                                DATE: 11/08/21 Therapeutic Exercise: Nustep L5 UE/LE x 6 min Standing shoulder row 2x15 GTB Standing shoulder ext 2x15 GTB Paloff side steps c RTB x10 each Paloff  press c RTB x15 each Gentle LTR bilat Gentle open book  bilat Piriformis stretch 20" bilat Posterior pelvic tilt x15 3" 90/90 abd heel taps x10 each Standing QL stretch 2x 20" Updated HEP  [facility] Adult PT Treatment:                                                DATE: 11/04/21 Therapeutic Exercise: Nustep L5 UE/LE x 6 min Standing shoulder row 2x15 GTB Standing shoulder ext 2x15 GTB STS c arm lifts 10# 2x10  Omega knee ext bilat 3x10 10# Updated HEP  [facility] Adult PT Treatment:                                                DATE: 11/02/21 Brace removed for treatment Therapeutic Exercise: Nustep L4 UE/LE x 6 min STS x  12 Without UE Supine A/P gentle pelvic tilts  Gentle LTR Gentle SKTC Supine March Supine clam with blue band  Scheduled for aquatic therapy, education on attendance policy reviewed and pt verbalized understanding.   PATIENT EDUCATION:  Education details: Eval findings, POC, HEP, self care Person  educated: Patient and friend Education method: Explanation, Demonstration, Tactile cues, Verbal cues, and Handouts Education comprehension: verbalized understanding, returned demonstration, verbal cues required, and tactile cues required  HOME EXERCISE PROGRAM:   Access Code: [email or web address] URL: [email or web address] Date: 11/08/2021 Prepared by: Gar Ponto  Exercises - Sit to Stand with Arms Crossed  - 1-2 x daily - 7 x weekly - 1-2 sets - 10 reps - 3 hold - Heel Toe Raises with Counter Support  - 1-2 x daily - 7 x weekly - 1-2 sets - 10 reps - 3 hold - Hooklying Clamshell with Resistance  - 1-2 x daily - 7 x weekly - 1-2 sets - 10 reps - 3 hold - Standing Shoulder Row with Anchored Resistance  - 1 x daily - 7 x weekly - 3 sets - 10 reps - 3 hold - Shoulder extension with resistance - Neutral  - 1 x daily - 7 x weekly - 3 sets - 10 reps - 3 hold - Anti-Rotation Lateral Stepping with Press  - 1 x daily - 7 x weekly - 2 sets - 10 reps - 3 hold - Anti-Rotation Sidestepping with Resistance  - 1 x daily - 7 x weekly - 2 sets - 10 reps - 3 hold - Supine Posterior Pelvic Tilt  - 1 x daily - 7 x weekly - 1 sets - 10 reps - 5 hold - Supine 90/90 Alternating Heel Touches with Posterior Pelvic Tilt  - 1 x daily - 7 x weekly - 2 sets - 10 reps - 2 hold - Supine Lower Trunk Rotation  - 1 x daily - 7 x weekly - 1 sets - 10 reps - 5 hold - Sidelying Thoracic Rotation with Open Book  - 1 x daily - 7 x weekly - 1 sets - 10 reps - 5 hold - Supine Piriformis Stretch with Foot on Ground  - 1 x daily - 7 x weekly - 1 sets - 3 reps - 20 hold - Standing Quadratus Lumborum Stretch with Doorway  - 1 x daily - 7 x weekly - 1 sets - 3 reps - 20 hold    ASSESSMENT:   CLINICAL IMPRESSION: Session today focused on gentle hip and lumbar strengthening in the aquatic environment for use of buoyancy to offload joints and the viscosity of water as resistance during therapeutic exercise.  Tomasita reports low  back pain reduction in pool and was able to complete all exercises with minimal increase in pain.  Patient was able to tolerate all prescribed exercises in the aquatic environment with no adverse effects and reports 3/10 pain at the end of the session. Patient continues to benefit from skilled PT services on land and aquatic based and should be progressed as able to improve functional independence.    OBJECTIVE IMPAIRMENTS decreased activity tolerance, decreased balance, decreased endurance, decreased mobility, difficulty walking, decreased ROM, decreased strength, impaired flexibility, and pain.    ACTIVITY LIMITATIONS carrying, lifting, bending, sitting, standing, squatting, sleeping, stairs, transfers, bed mobility, bathing, toileting, dressing, locomotion level, and caring for others   PARTICIPATION LIMITATIONS: meal prep, cleaning, laundry, driving, shopping, community activity, and school   PERSONAL FACTORS Time since onset of injury/illness/exacerbation are also affecting patient's functional outcome.    REHAB POTENTIAL: Good   CLINICAL DECISION MAKING: Evolving/moderate complexity   EVALUATION COMPLEXITY: Moderate     GOALS:   SHORT TERM GOALS: Target date: 11/04/2021  Pt will be Ind in an initial HEP Baseline:initiated Goal status: MET for current HEP 11/08/21   2.  Pt will voice understanding of measures to assist in pain reduction Baseline: Instruction in bed mobility and proper turning Status: 11/08/21=Use ot  therex helps to decrease pain and tightness Goal status: MET   LONG TERM GOALS: Target date: 12/16/21   Pt will be Ind in a final HEP to maintain achieved LOF Baseline:Initiated  Goal status: INITIAL   2.  Increase knee strength to 5/5 and hip strength to 4+/5 to optimize functional mobility Baseline: see flow sheets Goal status: INITIAL   3.  Pt will progress to Ind ambualtion with out an assist device on level/uneven surfaces and will ascend/descend steps indly  in a reciprocal pattern and use of HR as needed Baseline: Needs a walker for ambulation and SBA on steps for safety  Goal status: INITIAL   4.  Improve 5xSTS by MCID of 5" and 2MWT by MCID of 40f as indication of improved functional mobility  Baseline: 5xSTS 24.5 " s use of hands; 2MWT TBA when pt can walk without an AD Goal status: INITIAL   5.  Pt will demonstrate trunk ROM with only min limitations for optimize function Baseline: Restricted by LS brace Goal status: INITIAL     PLAN: PT FREQUENCY: 2x/week   PT DURATION: 8 weeks   PLANNED INTERVENTIONS: Therapeutic exercises, Therapeutic activity, Balance training, Gait training, Patient/Family education, Self Care, Aquatic Therapy, Dry Needling, Electrical stimulation, Cryotherapy, Moist heat, Taping, Manual therapy, and Re-evaluation   PLAN FOR NEXT SESSION:  assess response to HEP; progress therex as indicated; use of modalities, manual therapy; and TPDN as indicated. Did she get any updates on her restrictions? She is scheduled for aquatics next week. Complete 2WMT.  KKevan NyReinhartsen PT 11/11/21 8:17 AM

## 2021-11-14 ENCOUNTER — Encounter: Payer: Medicaid Other | Admitting: Physical Therapy

## 2021-11-15 ENCOUNTER — Ambulatory Visit: Payer: Medicaid Other

## 2021-11-15 DIAGNOSIS — R262 Difficulty in walking, not elsewhere classified: Secondary | ICD-10-CM

## 2021-11-15 DIAGNOSIS — M5459 Other low back pain: Secondary | ICD-10-CM

## 2021-11-15 DIAGNOSIS — M6281 Muscle weakness (generalized): Secondary | ICD-10-CM

## 2021-11-15 DIAGNOSIS — R252 Cramp and spasm: Secondary | ICD-10-CM | POA: Diagnosis not present

## 2021-11-15 NOTE — Therapy (Incomplete)
OUTPATIENT PHYSICAL THERAPY TREATMENT NOTE/Re-Authorization   Patient Name: Courtney Mendez MRN: [id number] DOB:11-02-1990, 31 y.o., female Today's Date: 11/16/2021  PCP: Kerin Perna, NP   REFERRING PROVIDER: Marvis Moeller, NP  END OF SESSION:   PT End of Session - 11/16/21 0621     Visit Number 7    Number of Visits 15    Date for PT Re-Evaluation 12/16/21    Authorization Type [place] MEDICA[employer] BLUE    Authorization Time Period Approved 7 PT visits from 10/25/2021-12/23/2021: Re-auth  statrting 11/15/21 to 01/01/22    Authorization - Visit Number 7    Authorization - Number of Visits 7    Progress Note Due on Visit 10    PT Start Time 1638    PT Stop Time 1724    PT Time Calculation (min) 46 min    Equipment Utilized During Treatment Back brace    Activity Tolerance Patient tolerated treatment well    Behavior During Therapy WFL for tasks assessed/performed             Past Medical History:  Diagnosis Date   Heart murmur    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   THERAPY DIAG:  Other low back pain  Difficulty in walking, not elsewhere classified  Muscle weakness (generalized)  Rationale for Evaluation and Treatment Rehabilitation  PERTINENT HISTORY: Heart murmur   PRECAUTIONS: Back, no bending, arching, or lifting. Pt is wearing a thoracolumbar immobilization back brace which she can take off only to shower and when lying down. (11/02/21: saw MD - begin weaning from brace, ok for aquatics, no other updates, pt to call and clarify)  SUBJECTIVE:                                                                                                                                                                                       SUBJECTIVE STATEMENT:  Pt reports intermittent periods, approx 3-4 days out of a week, when her low back pain is low and then the other days she experiences pain and tightness of 6-8/10. Pt notes today she is experiencing more pain.  PAIN:  Are you having pain? Yes: NPRS scale:6-7/10 Pain location: Low back and upper gluteal, L > R  Pain description: tension, tightness Aggravating factors: prolonged standing and walking  Relieving factors: pain and muscle relaxer meds, heating pad, sitting, lying down  0-10/10 on eval   OBJECTIVE: (objective measures completed at initial evaluation unless otherwise dated)   DIAGNOSTIC FINDINGS:   Xray 08/27/21 FINDINGS: Three C-arm images of the upper lumbar spine demonstrate bilateral pedicle screw placement at the L1 and L3 levels and right pedicle screw placement at the L2 level. Normal alignment.   IMPRESSION: Operative changes, as described above.   PATIENT SURVEYS:  LEFS 19/80   COGNITION:           Overall cognitive status: Within functional limits for tasks assessed                          SENSATION: Intact, but pt reports her L leg will become numb intermittently, esp with prolonged sitting   EDEMA:  Not observed/palpated of the legs   MUSCLE LENGTH: Hamstrings: Right 70 deg; Left 68 deg Thomas test: Right NT deg; Left NT deg   POSTURE: forward head; due to LS back brace , unable to assess thoracic and lumbar posture   PALPATION: Pt was not TTP of the lumbar paraspinals   LUMBAR ROM:  NT due to post surgical back restrictions Active  A/PROM  10/14/2021  Flexion    Extension    Right lateral flexion    Left lateral flexion    Right rotation    Left rotation     (Blank rows = not tested)   LOWER EXTREMITY ROM:                       Grosssly  WNLS Active ROM Right eval Left eval  Hip flexion      Hip extension      Hip abduction      Hip adduction      Hip internal rotation      Hip external rotation      Knee flexion      Knee extension      Ankle dorsiflexion      Ankle plantarflexion      Ankle inversion      Ankle eversion       (Blank rows = not tested)   LOWER EXTREMITY MMT:   MMT Right eval Left eval RT LT  Hip flexion 4+ 4+    Hip extension NT NT    Hip abduction 4 4    Hip adduction 5 5    Hip internal rotation 5 5    Hip external rotation 4+ 4+    Knee flexion _0 Knee extension 4+ 4+ 5 5  Ankle dorsiflexion 5 5    Ankle plantarflexion 5 5    Ankle inversion        Ankle eversion         (Blank rows = not tested)   LUMBAR SPECIAL TESTS:  Straight leg raise test: Negative   LOWER EXTREMITY SPECIAL TESTS:  None completed   FUNCTIONAL TESTS:  5 times sit to stand: 24.5' from bariatric chair s use of hands 6 minute walk test: TBA when pt is able to walk without an assist device 11/15/21 5xSTS= 21.2 sec   GAIT: Distance walked: 200' Assistive device utilized: Environmental consultant - 4 wheeled Level of assistance: Modified independence Comments: Decreased pace, equall step length   TRANSFERS:                       -  Ind with sit to stand transfer to mat table -Verbal instruction and CGA for sit to supine transfer c log rolling from side to supine. Pt returned to sitting from supine c verbal cueing only. Pt reports tolerating the transfer and lying supine without increase in pain.     TODAY'S TREATMENT: [facility] Adult PT Treatment:                                                DATE: 11/15/21 Therapeutic Exercise: Nustep L5 UE/LE x 6 min Standing shoulder row 2x15 GTB Standing shoulder ext 2x15 GTB Paloff  press c RTB x15 each Gentle LTR bilat Gentle open book  bilat Piriformis stretch 20" bilat Posterior pelvic tilt x15 3" 90/90 abd heel taps x10 each Standing QL stretch 2x 20" 5xSTS  Manual  Therapy: STM and DTM to the low back paraspinals and quadratus lumborum, L more so than the R Self care: For wife to provide STM to her low back for pain and muscle tension reduction  TREATMENT 11/10/21:  Aquatic therapy at [address] - therapeutic pool temp 92 degrees Pt enters building independently.  Treatment took place in water 3.8 to  4 ft 8 in.feet deep depending upon activity.  Pt entered and exited the pool via stair and handrails    Aquatic Therapy:  Water walking for warm up  Stretching: Runners stretch on bottom step x30" BIL Hamstring stretch on bottom step x30" BIL Figure 4 squat stretch, BIL UE support 2x30" BIL (NT)  At edge of pool, pt performed LE exercise: Standing march x20 Walking march 2 laps Hamstring curl x20 BIL Shoulder abd then add adduction with rainbow DB with abdominal bracing - 20x Kickboard push pull with AB - 20x Hip abd/add x20 BIL Squats x20 Step ups fwd and lat on submerged step 2x10 BIL  Sitting on bench in water: Bicycle kicks x1' Flutter kicks x1' Scissor kicks x1'  Pt requires the buoyancy of water for active assisted exercises with buoyancy supported for strengthening and AROM exercises. Hydrostatic pressure also supports joints by unweighting joint load by at least 50 % in 3-4 feet depth water. 80% in chest to neck deep water. Water will provide assistance with movement using the current and laminar flow while the buoyancy reduces weight bearing. Pt requires the viscosity of the water for resistance with strengthening exercises.   [facility] Adult PT Treatment:                                                DATE: 11/08/21 Therapeutic Exercise: Nustep L5 UE/LE x 6 min Standing shoulder row 2x15 GTB Standing shoulder ext 2x15 GTB Paloff side steps c RTB x10 each Paloff  press c RTB x15 each Gentle LTR bilat Gentle open book  bilat Piriformis stretch 20" bilat Posterior pelvic tilt x15 3" 90/90 abd heel taps x10  each Standing QL stretch 2x 20" Updated HEP   PATIENT EDUCATION:  Education details: Eval findings, POC, HEP, self care Person educated: Patient and friend Education method: Explanation, Demonstration, Tactile cues, Verbal cues, and Handouts Education comprehension: verbalized understanding, returned demonstration, verbal cues required, and tactile cues required     HOME EXERCISE PROGRAM:   Access Code:  [email or web address] URL: [email or web address] Date: 11/08/2021 Prepared by: Gar Ponto  Exercises - Sit to Stand with Arms Crossed  - 1-2 x daily - 7 x weekly - 1-2 sets - 10 reps - 3 hold - Heel Toe Raises with Counter Support  - 1-2 x daily - 7 x weekly - 1-2 sets - 10 reps - 3 hold - Hooklying Clamshell with Resistance  - 1-2 x daily - 7 x weekly - 1-2 sets - 10 reps - 3 hold - Standing Shoulder Row with Anchored Resistance  - 1 x daily - 7 x weekly - 3 sets - 10 reps - 3 hold - Shoulder extension with resistance - Neutral  - 1 x daily - 7 x weekly - 3 sets - 10 reps - 3 hold - Anti-Rotation Lateral Stepping with Press  - 1 x daily - 7 x weekly - 2 sets - 10 reps - 3 hold - Anti-Rotation Sidestepping with Resistance  - 1 x daily - 7 x weekly - 2 sets - 10 reps - 3 hold - Supine Posterior Pelvic Tilt  - 1 x daily - 7 x weekly - 1 sets - 10 reps - 5 hold - Supine 90/90 Alternating Heel Touches with Posterior Pelvic Tilt  - 1 x daily - 7 x weekly - 2 sets - 10 reps - 2 hold - Supine Lower Trunk Rotation  - 1 x daily - 7 x weekly - 1 sets - 10 reps - 5 hold - Sidelying Thoracic Rotation with Open Book  - 1 x daily - 7 x weekly - 1 sets - 10 reps - 5 hold - Supine Piriformis Stretch with Foot on Ground  - 1 x daily - 7 x weekly - 1 sets - 3 reps - 20 hold - Standing Quadratus Lumborum Stretch with Doorway  - 1 x daily - 7 x weekly - 1 sets - 3 reps - 20 hold    ASSESSMENT:   CLINICAL IMPRESSION: PT was completed today to address low back pain, L>R. STM and DTM was provided to  the paraspinals and QL to address pain and muscle tension. Manual therapy was f/b therex for lumbopelvic flexibility and strengthening. Following the session, pt reported a decrease in her low back pain to 3/10. Pt's strength and function was also reassessed with 5XSTS improved since eval, knee extension strength is improved meeting the goal for this muscle group, and pt has progressed to walking without an AD. Pt wears her back brace when she is not home, and is gradually weaning herself from the back brace outside of home. Pt will continue to benefit from skilled PT to address impairments for trunk/upper body/lower body weakness and trunk ROM to improve function with less pain.  OBJECTIVE IMPAIRMENTS decreased activity tolerance, decreased balance, decreased endurance, decreased mobility, difficulty walking, decreased ROM, decreased strength, impaired flexibility, and pain.    ACTIVITY LIMITATIONS carrying, lifting, bending, sitting, standing, squatting, sleeping, stairs, transfers, bed mobility, bathing, toileting, dressing, locomotion level, and caring for others   PARTICIPATION LIMITATIONS: meal prep, cleaning, laundry, driving, shopping, community activity, and school   PERSONAL FACTORS Time since onset of injury/illness/exacerbation are also affecting patient's functional outcome.    REHAB POTENTIAL: Good   CLINICAL DECISION MAKING: Evolving/moderate complexity   EVALUATION COMPLEXITY: Moderate     GOALS:   SHORT TERM GOALS: Target date: 11/04/2021  Pt will be Ind in an initial HEP Baseline:initiated Goal status: MET for current HEP 11/08/21   2.  Pt will  voice understanding of measures to assist in pain reduction Baseline: Instruction in bed mobility and proper turning Status: 11/08/21=Use ot therex helps to decrease pain and tightness Goal status: MET   LONG TERM GOALS: Target date: 12/16/21   Pt will be Ind in a final HEP to maintain achieved LOF Baseline:Initiated  Goal  status: Ongoing   2.  Increase knee strength to 5/5 and hip strength to 4+/5 to optimize functional mobility Baseline: see flow sheets Status:11/15/21= Knee ext 5/5 Goal status: Improving   3.  Pt will progress to Ind ambualtion with out an assist device on level/uneven surfaces and will ascend/descend steps indly in a reciprocal pattern and use of HR as needed Baseline: Needs a walker for ambulation and SBA on steps for safety  Status: 11/15/21=Walking without an AD Goal status: Improving   4.  Improve 5xSTS by MCID of 5" and 2MWT by MCID of 1f as indication of improved functional mobility  Baseline: 5xSTS 24.5 " s use of hands; 2MWT TBA when pt can walk without an AD Status: 11/15/21= 21.2" s use of hands Goal status: Improving   5.  Pt will demonstrate trunk ROM with only min limitations for optimize function Baseline: Restricted by LS brace Goal status: Ongoing     PLAN: PT FREQUENCY: 1x/week   PT DURATION: 8 weeks   PLANNED INTERVENTIONS: Therapeutic exercises, Therapeutic activity, Balance training, Gait training, Patient/Family education, Self Care, Aquatic Therapy, Dry Needling, Electrical stimulation, Cryotherapy, Moist heat, Taping, Manual therapy, and Re-evaluation   PLAN FOR NEXT SESSION:  assess response to HEP; progress therex as indicated; use of modalities, manual therapy; and TPDN as indicated. Did she get any updates on her restrictions? She is scheduled for aquatics next week. Complete 2WMT.  Tammera Engert MS, PT 11/16/21 9:52 AM

## 2021-11-16 ENCOUNTER — Encounter: Payer: Medicaid Other | Admitting: Physical Therapy

## 2021-11-17 ENCOUNTER — Encounter: Payer: Self-pay | Admitting: Physical Therapy

## 2021-11-17 ENCOUNTER — Ambulatory Visit: Payer: Medicaid Other | Admitting: Physical Therapy

## 2021-11-17 DIAGNOSIS — M6281 Muscle weakness (generalized): Secondary | ICD-10-CM | POA: Diagnosis not present

## 2021-11-17 DIAGNOSIS — M5459 Other low back pain: Secondary | ICD-10-CM | POA: Diagnosis not present

## 2021-11-17 DIAGNOSIS — R252 Cramp and spasm: Secondary | ICD-10-CM | POA: Diagnosis not present

## 2021-11-17 DIAGNOSIS — R262 Difficulty in walking, not elsewhere classified: Secondary | ICD-10-CM

## 2021-11-17 NOTE — Therapy (Signed)
OUTPATIENT PHYSICAL THERAPY TREATMENT NOTE   Patient Name: Courtney Mendez MRN: [id number] DOB:30-Dec-1990, 31 y.o., female Today's Date: 11/18/2021  PCP: Kerin Perna, NP   REFERRING PROVIDER: Marvis Moeller, NP  END OF SESSION:   PT End of Session - 11/17/21 1619     Visit Number 8    Number of Visits 15    Date for PT Re-Evaluation 12/16/21    Authorization Type [place] MEDICAID HEALTHY BLUE    Authorization Time Period Approved 7 PT visits from 10/25/2021-12/23/2021: Re-auth  statrting 11/15/21 to 01/01/22    Authorization - Number of Visits 7    Progress Note Due on Visit 10    Equipment Utilized During Treatment Back brace    Activity Tolerance Patient tolerated treatment well    Behavior During Therapy [facility] for tasks assessed/performed              Past Medical History:  Diagnosis Date   Heart murmur    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   THERAPY DIAG:  Other low back pain  Difficulty in walking, not elsewhere classified  Muscle weakness (generalized)  Cramp and spasm  Rationale for Evaluation and Treatment Rehabilitation  PERTINENT HISTORY: Heart murmur   PRECAUTIONS: Back, no bending, arching, or lifting. Pt is wearing a thoracolumbar immobilization back brace which she can take off only to shower and when lying down. (11/02/21: saw MD - begin weaning from brace, ok for aquatics, no other updates, pt to call and clarify)  SUBJECTIVE:                                                                                                                                                                                       SUBJECTIVE STATEMENT:  Pt reports that she is having a "normal" day regarding her pain.  She reports improved activity tolerance and balance at home.  PAIN:  Are you having pain? Yes: NPRS scale: 5/10 Pain location: Low back and upper gluteal, L > R Pain description: tension, tightness Aggravating factors: prolonged standing and walking  Relieving factors: pain and muscle relaxer meds, heating pad, sitting, lying down  0-10/10 on eval   OBJECTIVE: (objective measures completed at initial evaluation unless otherwise dated)  DIAGNOSTIC FINDINGS:   Xray 08/27/21 FINDINGS: Three C-arm images of the upper lumbar spine demonstrate bilateral pedicle screw placement at the L1 and L3 levels and right pedicle screw placement at the L2 level. Normal alignment.   IMPRESSION: Operative changes, as described above.   PATIENT SURVEYS:  LEFS 19/80   COGNITION:           Overall cognitive status: Within functional limits for tasks assessed                          SENSATION: Intact, but pt reports her L leg will become numb intermittently, esp with prolonged sitting   EDEMA:  Not observed/palpated of the legs   MUSCLE LENGTH: Hamstrings: Right 70 deg; Left 68 deg Thomas test: Right NT deg; Left NT deg   POSTURE: forward head; due to LS back brace , unable to assess thoracic and lumbar posture   PALPATION: Pt was not TTP of the lumbar paraspinals   LUMBAR ROM:  NT due to post surgical back restrictions Active  A/PROM  10/14/2021  Flexion    Extension    Right lateral flexion    Left lateral flexion    Right rotation    Left rotation     (Blank rows = not tested)   LOWER EXTREMITY ROM:                       Grosssly WNLS Active ROM Right eval Left eval  Hip flexion      Hip extension      Hip abduction      Hip adduction      Hip internal rotation      Hip external rotation      Knee flexion      Knee  extension      Ankle dorsiflexion      Ankle plantarflexion      Ankle inversion      Ankle eversion       (Blank rows = not tested)   LOWER EXTREMITY MMT:   MMT Right eval Left eval RT LT  Hip flexion 4+ 4+    Hip extension NT NT    Hip abduction 4 4    Hip adduction 5 5    Hip internal rotation 5 5    Hip external rotation 4+ 4+    Knee flexion _0 Knee extension 4+ 4+ 5 5  Ankle dorsiflexion 5 5    Ankle plantarflexion 5 5    Ankle inversion        Ankle eversion         (Blank rows = not tested)   LUMBAR SPECIAL TESTS:  Straight leg raise test: Negative   LOWER EXTREMITY SPECIAL TESTS:  None completed   FUNCTIONAL TESTS:  5 times sit to stand: 24.5' from bariatric chair s use of hands 6 minute walk test: TBA when pt is able to walk without an assist device 11/15/21 5xSTS= 21.2 sec   GAIT: Distance walked: 200' Assistive device utilized: Environmental consultant - 4 wheeled Level of assistance: Modified independence Comments: Decreased pace, equall step length   TRANSFERS:                       -Ind with sit to stand transfer to mat table -Verbal instruction and CGA for sit to supine transfer c log rolling from side to supine. Pt returned to  sitting from supine c verbal cueing only. Pt reports tolerating the transfer and lying supine without increase in pain.     TODAY'S TREATMENT:  TREATMENT 11/17/21:  Aquatic therapy at [address] - therapeutic pool temp 92 degrees Pt enters building independently.  Treatment took place in water 3.8 to  4 ft 8 in.feet deep depending upon activity.  Pt entered and exited the pool via stair and handrails    Aquatic Therapy:  Water walking for warm up  Stretching: Runners stretch on bottom step x30" BIL Hamstring stretch on bottom step x30" BIL Figure 4 squat stretch, BIL UE support 2x30" BIL (NT)  At edge of pool, pt performed LE exercise: Lateral walking with rainbow dumbell adduction - 3 laps Walking march  3 laps with dumbell push down Hamstring curl x20 BIL Shoulder abd then add adduction with Yellow DB with abdominal bracing - 20x Kickboard push pull with AB - 20x Hip abd/add circles x20 BIL Hip hinge EOB - 20x Squats x20 Step ups fwd and lat on submerged step 2x10 BIL   Pt requires the buoyancy of water for active assisted exercises with buoyancy supported for strengthening and AROM exercises. Hydrostatic pressure also supports joints by unweighting joint load by at least 50 % in 3-4 feet depth water. 80% in chest to neck deep water. Water will provide assistance with movement using the current and laminar flow while the buoyancy reduces weight bearing. Pt requires the viscosity of the water for resistance with strengthening exercises.    HOME EXERCISE PROGRAM:   Access Code: [email or web address] URL: [email or web address] Date: 11/08/2021 Prepared by: Gar Ponto  Exercises - Sit to Stand with Arms Crossed  - 1-2 x daily - 7 x weekly - 1-2 sets - 10 reps - 3 hold - Heel Toe Raises with Counter Support  - 1-2 x daily - 7 x weekly - 1-2 sets - 10 reps - 3 hold - Hooklying Clamshell with Resistance  - 1-2 x daily - 7 x weekly - 1-2 sets - 10 reps - 3 hold - Standing Shoulder Row with Anchored Resistance  - 1 x daily - 7 x weekly - 3 sets - 10 reps - 3 hold - Shoulder extension with resistance - Neutral  - 1 x daily - 7 x weekly - 3 sets - 10 reps - 3 hold - Anti-Rotation Lateral Stepping with Press  - 1 x daily - 7 x weekly - 2 sets - 10 reps - 3 hold - Anti-Rotation Sidestepping with Resistance  - 1 x daily - 7 x weekly - 2 sets - 10 reps - 3 hold - Supine Posterior Pelvic Tilt  - 1 x daily - 7 x weekly - 1 sets - 10 reps - 5 hold - Supine 90/90 Alternating Heel Touches with Posterior Pelvic Tilt  - 1 x daily - 7 x weekly - 2 sets - 10 reps - 2 hold - Supine Lower Trunk Rotation  - 1 x daily - 7 x weekly - 1 sets - 10 reps - 5 hold - Sidelying Thoracic Rotation with Open Book  - 1 x  daily - 7 x weekly - 1 sets - 10 reps - 5 hold - Supine Piriformis Stretch with Foot on Ground  - 1 x daily - 7 x weekly - 1 sets - 3 reps - 20 hold - Standing Quadratus Lumborum Stretch with Doorway  - 1 x daily - 7 x weekly - 1 sets - 3 reps -  20 hold    ASSESSMENT:   CLINICAL IMPRESSION: Session today focused on core and hip strength as well as overall activity tolerance in the aquatic environment for use of buoyancy to offload joints and the viscosity of water as resistance during therapeutic exercise.  Christene Lye shows reduced fatigue and improved strength with multiple exercises today.  Patient was able to tolerate all prescribed exercises in the aquatic environment with no adverse effects and reports 4/10 pain at the end of the session. Patient continues to benefit from skilled PT services on land and aquatic based and should be progressed as able to improve functional independence.    OBJECTIVE IMPAIRMENTS decreased activity tolerance, decreased balance, decreased endurance, decreased mobility, difficulty walking, decreased ROM, decreased strength, impaired flexibility, and pain.    ACTIVITY LIMITATIONS carrying, lifting, bending, sitting, standing, squatting, sleeping, stairs, transfers, bed mobility, bathing, toileting, dressing, locomotion level, and caring for others   PARTICIPATION LIMITATIONS: meal prep, cleaning, laundry, driving, shopping, community activity, and school   PERSONAL FACTORS Time since onset of injury/illness/exacerbation are also affecting patient's functional outcome.    REHAB POTENTIAL: Good   CLINICAL DECISION MAKING: Evolving/moderate complexity   EVALUATION COMPLEXITY: Moderate     GOALS:   SHORT TERM GOALS: Target date: 11/04/2021  Pt will be Ind in an initial HEP Baseline:initiated Goal status: MET for current HEP 11/08/21   2.  Pt will voice understanding of measures to assist in pain reduction Baseline: Instruction in bed mobility and proper  turning Status: 11/08/21=Use ot therex helps to decrease pain and tightness Goal status: MET   LONG TERM GOALS: Target date: 12/16/21   Pt will be Ind in a final HEP to maintain achieved LOF Baseline:Initiated  Goal status: Ongoing   2.  Increase knee strength to 5/5 and hip strength to 4+/5 to optimize functional mobility Baseline: see flow sheets Status:11/15/21= Knee ext 5/5 Goal status: Improving   3.  Pt will progress to Ind ambualtion with out an assist device on level/uneven surfaces and will ascend/descend steps indly in a reciprocal pattern and use of HR as needed Baseline: Needs a walker for ambulation and SBA on steps for safety  Status: 11/15/21=Walking without an AD Goal status: Improving   4.  Improve 5xSTS by MCID of 5" and 2MWT by MCID of 78f as indication of improved functional mobility  Baseline: 5xSTS 24.5 " s use of hands; 2MWT TBA when pt can walk without an AD Status: 11/15/21= 21.2" s use of hands Goal status: Improving   5.  Pt will demonstrate trunk ROM with only min limitations for optimize function Baseline: Restricted by LS brace Goal status: Ongoing     PLAN: PT FREQUENCY: 1x/week   PT DURATION: 8 weeks   PLANNED INTERVENTIONS: Therapeutic exercises, Therapeutic activity, Balance training, Gait training, Patient/Family education, Self Care, Aquatic Therapy, Dry Needling, Electrical stimulation, Cryotherapy, Moist heat, Taping, Manual therapy, and Re-evaluation   PLAN FOR NEXT SESSION:  assess response to HEP; progress therex as indicated; use of modalities, manual therapy; and TPDN as indicated. Did she get any updates on her restrictions? She is scheduled for aquatics next week. Complete 2WMT.  KKevan NyReinhartsen PT 11/18/21 10:03 AM

## 2021-11-22 ENCOUNTER — Ambulatory Visit: Payer: Medicaid Other

## 2021-11-22 ENCOUNTER — Encounter: Payer: Medicaid Other | Admitting: Physical Therapy

## 2021-11-22 NOTE — Therapy (Incomplete)
OUTPATIENT PHYSICAL THERAPY TREATMENT NOTE   Patient Name: Courtney Mendez MRN: [id number] DOB:June 11, 1990, 31 y.o., female Today's Date: 11/18/2021  PCP: Kerin Perna, NP   REFERRING PROVIDER: Marvis Moeller, NP  END OF SESSION:   PT End of Session - 11/17/21 1619     Visit Number 8    Number of Visits 15    Date for PT Re-Evaluation 12/16/21    Authorization Type [place] MEDICAID HEALTHY BLUE    Authorization Time Period Approved 7 PT visits from 10/25/2021-12/23/2021: Re-auth  statrting 11/15/21 to 01/01/22    Authorization - Number of Visits 7    Progress Note Due on Visit 10    Equipment Utilized During Treatment Back brace    Activity Tolerance Patient tolerated treatment well    Behavior During Therapy [facility] for tasks assessed/performed              Past Medical History:  Diagnosis Date   Heart murmur    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   THERAPY DIAG:  Other low back pain  Difficulty in walking, not elsewhere classified  Muscle weakness (generalized)  Cramp and spasm  Rationale for Evaluation and Treatment Rehabilitation  PERTINENT HISTORY: Heart murmur   PRECAUTIONS: Back, no bending, arching, or lifting. Pt is wearing a thoracolumbar immobilization back brace which she can take off only to shower and when lying down. (11/02/21: saw MD - begin weaning from brace, ok for aquatics, no other updates, pt to call and clarify)  SUBJECTIVE:                                                                                                                                                                                       SUBJECTIVE STATEMENT:  Pt reports that she is having a "normal" day regarding her pain.  She reports improved activity tolerance and balance at home.  PAIN:  Are you having pain? Yes: NPRS scale: 5/10 Pain location: Low back and upper gluteal, L > R Pain description: tension, tightness Aggravating factors: prolonged standing and walking  Relieving factors: pain and muscle relaxer meds, heating pad, sitting, lying down  0-10/10 on eval   OBJECTIVE: (objective measures completed at initial evaluation unless otherwise dated)  DIAGNOSTIC FINDINGS:   Xray 08/27/21 FINDINGS: Three C-arm images of the upper lumbar spine demonstrate bilateral pedicle screw placement at the L1 and L3 levels and right pedicle screw placement at the L2 level. Normal alignment.   IMPRESSION: Operative changes, as described above.   PATIENT SURVEYS:  LEFS 19/80   COGNITION:           Overall cognitive status: Within functional limits for tasks assessed                          SENSATION: Intact, but pt reports her L leg will become numb intermittently, esp with prolonged sitting   EDEMA:  Not observed/palpated of the legs   MUSCLE LENGTH: Hamstrings: Right 70 deg; Left 68 deg Thomas test: Right NT deg; Left NT deg   POSTURE: forward head; due to LS back brace , unable to assess thoracic and lumbar posture   PALPATION: Pt was not TTP of the lumbar paraspinals   LUMBAR ROM:  NT due to post surgical back restrictions Active  A/PROM  10/14/2021  Flexion    Extension    Right lateral flexion    Left lateral flexion    Right rotation    Left rotation     (Blank rows = not tested)   LOWER EXTREMITY ROM:                       Grosssly WNLS Active ROM Right eval Left eval  Hip flexion      Hip extension      Hip abduction      Hip adduction      Hip internal rotation      Hip external rotation      Knee flexion      Knee  extension      Ankle dorsiflexion      Ankle plantarflexion      Ankle inversion      Ankle eversion       (Blank rows = not tested)   LOWER EXTREMITY MMT:   MMT Right eval Left eval RT LT  Hip flexion 4+ 4+    Hip extension NT NT    Hip abduction 4 4    Hip adduction 5 5    Hip internal rotation 5 5    Hip external rotation 4+ 4+    Knee flexion _0 Knee extension 4+ 4+ 5 5  Ankle dorsiflexion 5 5    Ankle plantarflexion 5 5    Ankle inversion        Ankle eversion         (Blank rows = not tested)   LUMBAR SPECIAL TESTS:  Straight leg raise test: Negative   LOWER EXTREMITY SPECIAL TESTS:  None completed   FUNCTIONAL TESTS:  5 times sit to stand: 24.5' from bariatric chair s use of hands 6 minute walk test: TBA when pt is able to walk without an assist device 11/15/21 5xSTS= 21.2 sec   GAIT: Distance walked: 200' Assistive device utilized: Environmental consultant - 4 wheeled Level of assistance: Modified independence Comments: Decreased pace, equall step length   TRANSFERS:                       -Ind with sit to stand transfer to mat table -Verbal instruction and CGA for sit to supine transfer c log rolling from side to supine. Pt returned to  sitting from supine c verbal cueing only. Pt reports tolerating the transfer and lying supine without increase in pain.     TODAY'S TREATMENT: [facility] Adult PT Treatment:                                                DATE: 11/22/21 Therapeutic Exercise: *** Manual Therapy: *** Neuromuscular re-ed: *** Therapeutic Activity: *** Modalities: *** Self Care: ***  TREATMENT 11/17/21:  Aquatic therapy at [address] - therapeutic pool temp 92 degrees Pt enters building independently.  Treatment took place in water 3.8 to  4 ft 8 in.feet deep depending upon activity.  Pt entered and exited the pool via stair and handrails    Aquatic Therapy:  Water walking for warm up  Stretching: Runners stretch on bottom  step x30" BIL Hamstring stretch on bottom step x30" BIL Figure 4 squat stretch, BIL UE support 2x30" BIL (NT)  At edge of pool, pt performed LE exercise: Lateral walking with rainbow dumbell adduction - 3 laps Walking march 3 laps with dumbell push down Hamstring curl x20 BIL Shoulder abd then add adduction with Yellow DB with abdominal bracing - 20x Kickboard push pull with AB - 20x Hip abd/add circles x20 BIL Hip hinge EOB - 20x Squats x20 Step ups fwd and lat on submerged step 2x10 BIL   Pt requires the buoyancy of water for active assisted exercises with buoyancy supported for strengthening and AROM exercises. Hydrostatic pressure also supports joints by unweighting joint load by at least 50 % in 3-4 feet depth water. 80% in chest to neck deep water. Water will provide assistance with movement using the current and laminar flow while the buoyancy reduces weight bearing. Pt requires the viscosity of the water for resistance with strengthening exercises.    HOME EXERCISE PROGRAM:   Access Code: [email or web address] URL: [email or web address] Date: 11/08/2021 Prepared by: Gar Ponto  Exercises - Sit to Stand with Arms Crossed  - 1-2 x daily - 7 x weekly - 1-2 sets - 10 reps - 3 hold - Heel Toe Raises with Counter Support  - 1-2 x daily - 7 x weekly - 1-2 sets - 10 reps - 3 hold - Hooklying Clamshell with Resistance  - 1-2 x daily - 7 x weekly - 1-2 sets - 10 reps - 3 hold - Standing Shoulder Row with Anchored Resistance  - 1 x daily - 7 x weekly - 3 sets - 10 reps - 3 hold - Shoulder extension with resistance - Neutral  - 1 x daily - 7 x weekly - 3 sets - 10 reps - 3 hold - Anti-Rotation Lateral Stepping with Press  - 1 x daily - 7 x weekly - 2 sets - 10 reps - 3 hold - Anti-Rotation Sidestepping with Resistance  - 1 x daily - 7 x weekly - 2 sets - 10 reps - 3 hold - Supine Posterior Pelvic Tilt  - 1 x daily - 7 x weekly - 1 sets - 10 reps - 5 hold - Supine 90/90 Alternating  Heel Touches with Posterior Pelvic Tilt  - 1 x daily - 7 x weekly - 2 sets - 10 reps - 2 hold - Supine Lower Trunk Rotation  - 1 x daily - 7 x weekly - 1 sets - 10 reps - 5 hold - Sidelying Thoracic Rotation  with Open Book  - 1 x daily - 7 x weekly - 1 sets - 10 reps - 5 hold - Supine Piriformis Stretch with Foot on Ground  - 1 x daily - 7 x weekly - 1 sets - 3 reps - 20 hold - Standing Quadratus Lumborum Stretch with Doorway  - 1 x daily - 7 x weekly - 1 sets - 3 reps - 20 hold    ASSESSMENT:   CLINICAL IMPRESSION: Session today focused on core and hip strength as well as overall activity tolerance in the aquatic environment for use of buoyancy to offload joints and the viscosity of water as resistance during therapeutic exercise.  Christene Lye shows reduced fatigue and improved strength with multiple exercises today.  Patient was able to tolerate all prescribed exercises in the aquatic environment with no adverse effects and reports 4/10 pain at the end of the session. Patient continues to benefit from skilled PT services on land and aquatic based and should be progressed as able to improve functional independence.    OBJECTIVE IMPAIRMENTS decreased activity tolerance, decreased balance, decreased endurance, decreased mobility, difficulty walking, decreased ROM, decreased strength, impaired flexibility, and pain.    ACTIVITY LIMITATIONS carrying, lifting, bending, sitting, standing, squatting, sleeping, stairs, transfers, bed mobility, bathing, toileting, dressing, locomotion level, and caring for others   PARTICIPATION LIMITATIONS: meal prep, cleaning, laundry, driving, shopping, community activity, and school   PERSONAL FACTORS Time since onset of injury/illness/exacerbation are also affecting patient's functional outcome.    REHAB POTENTIAL: Good   CLINICAL DECISION MAKING: Evolving/moderate complexity   EVALUATION COMPLEXITY: Moderate     GOALS:   SHORT TERM GOALS: Target date: 11/04/2021   Pt will be Ind in an initial HEP Baseline:initiated Goal status: MET for current HEP 11/08/21   2.  Pt will voice understanding of measures to assist in pain reduction Baseline: Instruction in bed mobility and proper turning Status: 11/08/21=Use ot therex helps to decrease pain and tightness Goal status: MET   LONG TERM GOALS: Target date: 12/16/21   Pt will be Ind in a final HEP to maintain achieved LOF Baseline:Initiated  Goal status: Ongoing   2.  Increase knee strength to 5/5 and hip strength to 4+/5 to optimize functional mobility Baseline: see flow sheets Status:11/15/21= Knee ext 5/5 Goal status: Improving   3.  Pt will progress to Ind ambualtion with out an assist device on level/uneven surfaces and will ascend/descend steps indly in a reciprocal pattern and use of HR as needed Baseline: Needs a walker for ambulation and SBA on steps for safety  Status: 11/15/21=Walking without an AD Goal status: Improving   4.  Improve 5xSTS by MCID of 5" and 2MWT by MCID of 84f as indication of improved functional mobility  Baseline: 5xSTS 24.5 " s use of hands; 2MWT TBA when pt can walk without an AD Status: 11/15/21= 21.2" s use of hands Goal status: Improving   5.  Pt will demonstrate trunk ROM with only min limitations for optimize function Baseline: Restricted by LS brace Goal status: Ongoing     PLAN: PT FREQUENCY: 1x/week   PT DURATION: 8 weeks   PLANNED INTERVENTIONS: Therapeutic exercises, Therapeutic activity, Balance training, Gait training, Patient/Family education, Self Care, Aquatic Therapy, Dry Needling, Electrical stimulation, Cryotherapy, Moist heat, Taping, Manual therapy, and Re-evaluation   PLAN FOR NEXT SESSION:  assess response to HEP; progress therex as indicated; use of modalities, manual therapy; and TPDN as indicated. Did she get any updates on her restrictions?  She is scheduled for aquatics next week. Complete 2WMT.  Kevan Ny Reinhartsen PT 11/18/21  10:03 AM

## 2021-11-23 ENCOUNTER — Encounter: Payer: Medicaid Other | Admitting: Physical Therapy

## 2021-11-28 ENCOUNTER — Encounter: Payer: Medicaid Other | Admitting: Physical Therapy

## 2021-11-29 ENCOUNTER — Ambulatory Visit: Payer: Medicaid Other

## 2021-11-29 DIAGNOSIS — R252 Cramp and spasm: Secondary | ICD-10-CM

## 2021-11-29 DIAGNOSIS — M5459 Other low back pain: Secondary | ICD-10-CM

## 2021-11-29 DIAGNOSIS — M6281 Muscle weakness (generalized): Secondary | ICD-10-CM | POA: Diagnosis not present

## 2021-11-29 DIAGNOSIS — R262 Difficulty in walking, not elsewhere classified: Secondary | ICD-10-CM | POA: Diagnosis not present

## 2021-11-29 NOTE — Therapy (Signed)
OUTPATIENT PHYSICAL THERAPY TREATMENT NOTE   Patient Name: Joury Allcorn MRN: [id number] DOB:1990-01-31, 31 y.o., female Today's Date: 11/29/2021  PCP: Kerin Perna, NP   REFERRING PROVIDER: Marvis Moeller, NP  END OF SESSION:   PT End of Session - 11/29/21 1656     Visit Number 9    Number of Visits 14    Date for PT Re-Evaluation 12/16/21    Authorization Type [place] [employer]ICA[employer] BLUE    Authorization Time Period Approved 7 PT visits from 10/25/2021-12/23/2021:  5 PT visits11/21/23-01/19/24    Authorization - Visit Number 8    Authorization - Number of Visits 13    Progress Note Due on Visit 10    PT Start Time 1430    PT Stop Time 1515    PT Time Calculation (min) 45 min    Equipment Utilized During Treatment Other (comment)   Soft back brace   Activity Tolerance Patient tolerated treatment well    Behavior During Therapy WFL for tasks assessed/performed               Past Medical History:  Diagnosis Date   Heart murmur    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) 08/27/2021   Pain in right shoulder 10/01/2019    REFERRING DIAG: S32.028D (ICD-10-CM) - Other fracture of second lumbar vertebra, subsequent encounter for fracture with routine healing.   THERAPY DIAG:  Other low back pain  Difficulty in walking, not elsewhere classified  Muscle weakness (generalized)  Cramp and spasm  Rationale for Evaluation and Treatment Rehabilitation  PERTINENT HISTORY: Heart murmur   PRECAUTIONS: Back, no bending, arching, or lifting. Pt is wearing a thoracolumbar immobilization back brace which she can take off only to shower and when lying down. (11/02/21: saw MD - begin weaning from brace, ok for aquatics, no other updates, pt to call and  clarify)  SUBJECTIVE:                                                                                                                                                                                      SUBJECTIVE STATEMENT:  Pt reports massage to her low back from her wife at home is helpful with managing her low back pain and tightness.  PAIN:  Are you having pain? Yes: NPRS scale: o/10 Pain location: Low back and upper gluteal, L > R Pain description: tension, tightness Aggravating factors: prolonged  standing and walking  Relieving factors: pain and muscle relaxer meds, heating pad, sitting, lying down  0-10/10 on eval   OBJECTIVE: (objective measures completed at initial evaluation unless otherwise dated)   DIAGNOSTIC FINDINGS:   Xray 08/27/21 FINDINGS: Three C-arm images of the upper lumbar spine demonstrate bilateral pedicle screw placement at the L1 and L3 levels and right pedicle screw placement at the L2 level. Normal alignment.   IMPRESSION: Operative changes, as described above.   PATIENT SURVEYS:  LEFS 19/80   COGNITION:           Overall cognitive status: Within functional limits for tasks assessed                          SENSATION: Intact, but pt reports her L leg will become numb intermittently, esp with prolonged sitting   EDEMA:  Not observed/palpated of the legs   MUSCLE LENGTH: Hamstrings: Right 70 deg; Left 68 deg Thomas test: Right NT deg; Left NT deg   POSTURE: forward head; due to LS back brace , unable to assess thoracic and lumbar posture   PALPATION: Pt was not TTP of the lumbar paraspinals   LUMBAR ROM:  NT due to post surgical back restrictions Active  A/PROM  10/14/2021  Flexion    Extension    Right lateral flexion    Left lateral flexion    Right rotation    Left rotation     (Blank rows = not tested)   LOWER EXTREMITY ROM:                       Grosssly WNLS Active ROM Right eval Left eval  Hip flexion      Hip  extension      Hip abduction      Hip adduction      Hip internal rotation      Hip external rotation      Knee flexion      Knee extension      Ankle dorsiflexion      Ankle plantarflexion      Ankle inversion      Ankle eversion       (Blank rows = not tested)   LOWER EXTREMITY MMT:   MMT Right eval Left eval RT LT  Hip flexion 4+ 4+    Hip extension NT NT    Hip abduction 4 4    Hip adduction 5 5    Hip internal rotation 5 5    Hip external rotation 4+ 4+    Knee flexion _0 Knee extension 4+ 4+ 5 5  Ankle dorsiflexion 5 5    Ankle plantarflexion 5 5    Ankle inversion        Ankle eversion         (Blank rows = not tested)  11/29/21 Plank on knees 50"   LUMBAR SPECIAL TESTS:  Straight leg raise test: Negative   LOWER EXTREMITY SPECIAL TESTS:  None completed   FUNCTIONAL TESTS:  5 times sit to stand: 24.5' from bariatric chair s use of hands 6 minute walk test: TBA when pt is able to walk without an assist device 11/15/21 5xSTS= 21.2 sec 11/29/21 2MWT= 555 ft   GAIT: Distance walked: 200' Assistive device utilized: Environmental consultant - 4 wheeled Level of assistance: Modified independence Comments: Decreased pace, equall step length   TRANSFERS:                       -  Ind with sit to stand transfer to mat table -Verbal instruction and CGA for sit to supine transfer c log rolling from side to supine. Pt returned to sitting from supine c verbal cueing only. Pt reports tolerating the transfer and lying supine without increase in pain.     TODAY'S TREATMENT: [facility] Adult PT Treatment:                                                DATE: 11/29/21 Therapeutic Exercise: Nustep L5 UE/LE x 6 min Gentle LTR bilat Gentle open book  bilat Posterior pelvic tilt x10 3" 90/90 abd heel taps 2x10 each Quadruped alt arm lifts x15 each Quadruped alt leg lifts x15 each Planks on knees x5 20", max attempt 50" Omega chest press 2x10 25# Omega pec press 2x10 25# Omega chest  pull 2x10 25# Omegalat pull 2x10 20# 2MWT 556f  11/15/21 Therapeutic Exercise: Nustep L5 UE/LE x 6 min Standing shoulder row 2x15 GTB Standing shoulder ext 2x15 GTB Paloff  press c RTB x15 each Gentle LTR bilat Gentle open book  bilat Piriformis stretch 20" bilat Posterior pelvic tilt x15 3" 90/90 abd heel taps x10 each Standing QL stretch 2x 20" 5xSTS   Manual Therapy: STM and DTM to the low back paraspinals and quadratus lumborum, L more so than the R Self care: For wife to provide STM to her low back for pain and muscle tension reduction  TREATMENT 11/17/21:  [facility]atic therapy [address] - therapeutic pool temp 92 degrees Pt enters building independently.  Treatment took place in water 3.8 to  4 ft 8 in.feet deep depending upon activity.  Pt entered and exited the pool via stair and handrails    Aquatic Therapy:  Water walking for warm up  Stretching: Runners stretch on bottom step x30" BIL Hamstring stretch on bottom step x30" BIL Figure 4 squat stretch, BIL UE support 2x30" BIL (NT)  At edge of pool, pt performed LE exercise: Lateral walking with rainbow dumbell adduction - 3 laps Walking march 3 laps with dumbell push down Hamstring curl x20 BIL Shoulder abd then add adduction with Yellow DB with abdominal bracing - 20x Kickboard push pull with AB - 20x Hip abd/add circles x20 BIL Hip hinge EOB - 20x Squats x20 Step ups fwd and lat on submerged step 2x10 BIL   Pt requires the buoyancy of water for active assisted exercises with buoyancy supported for strengthening and AROM exercises. Hydrostatic pressure also supports joints by unweighting joint load by at least 50 % in 3-4 feet depth water. 80% in chest to neck deep water. Water will provide assistance with movement using the current and laminar flow while the buoyancy reduces weight bearing. Pt requires the viscosity of the water for resistance with strengthening exercises.    HOME  EXERCISE PROGRAM:   Access Code: [email or web address] URL: [email or web address] Date: 11/08/2021 Prepared by: AGar Ponto Exercises - Sit to Stand with Arms Crossed  - 1-2 x daily - 7 x weekly - 1-2 sets - 10 reps - 3 hold - Heel Toe Raises with Counter Support  - 1-2 x daily - 7 x weekly - 1-2 sets - 10 reps - 3 hold - Hooklying Clamshell with Resistance  - 1-2 x daily - 7 x weekly - 1-2 sets - 10 reps - 3 hold - Standing  Shoulder Row with Anchored Resistance  - 1 x daily - 7 x weekly - 3 sets - 10 reps - 3 hold - Shoulder extension with resistance - Neutral  - 1 x daily - 7 x weekly - 3 sets - 10 reps - 3 hold - Anti-Rotation Lateral Stepping with Press  - 1 x daily - 7 x weekly - 2 sets - 10 reps - 3 hold - Anti-Rotation Sidestepping with Resistance  - 1 x daily - 7 x weekly - 2 sets - 10 reps - 3 hold - Supine Posterior Pelvic Tilt  - 1 x daily - 7 x weekly - 1 sets - 10 reps - 5 hold - Supine 90/90 Alternating Heel Touches with Posterior Pelvic Tilt  - 1 x daily - 7 x weekly - 2 sets - 10 reps - 2 hold - Supine Lower Trunk Rotation  - 1 x daily - 7 x weekly - 1 sets - 10 reps - 5 hold - Sidelying Thoracic Rotation with Open Book  - 1 x daily - 7 x weekly - 1 sets - 10 reps - 5 hold - Supine Piriformis Stretch with Foot on Ground  - 1 x daily - 7 x weekly - 1 sets - 3 reps - 20 hold - Standing Quadratus Lumborum Stretch with Doorway  - 1 x daily - 7 x weekly - 1 sets - 3 reps - 20 hold    ASSESSMENT:   CLINICAL IMPRESSION: PT was completed for trunk, upper body, and lower body strengthening and gentle trunk rotation.ROM. Pt is tolerated gradual progressive increases in  physical demand. 2MWT was assessed and pt was able to tolerate plank therex from her knees. Function is muck improved with pt walking at a moderate pace. Massage at home is helping manage her low back pain and muscle tightness. Pt will continue to benefit from skilled PT to address impairments for improved  function  OBJECTIVE IMPAIRMENTS decreased activity tolerance, decreased balance, decreased endurance, decreased mobility, difficulty walking, decreased ROM, decreased strength, impaired flexibility, and pain.    ACTIVITY LIMITATIONS carrying, lifting, bending, sitting, standing, squatting, sleeping, stairs, transfers, bed mobility, bathing, toileting, dressing, locomotion level, and caring for others   PARTICIPATION LIMITATIONS: meal prep, cleaning, laundry, driving, shopping, community activity, and school   PERSONAL FACTORS Time since onset of injury/illness/exacerbation are also affecting patient's functional outcome.    REHAB POTENTIAL: Good   CLINICAL DECISION MAKING: Evolving/moderate complexity   EVALUATION COMPLEXITY: Moderate     GOALS:   SHORT TERM GOALS: Target date: 11/04/2021  Pt will be Ind in an initial HEP Baseline:initiated Goal status: MET for current HEP 11/08/21   2.  Pt will voice understanding of measures to assist in pain reduction Baseline: Instruction in bed mobility and proper turning Status: 11/08/21=Use ot therex helps to decrease pain and tightness Goal status: MET   LONG TERM GOALS: Target date: 12/16/21   Pt will be Ind in a final HEP to maintain achieved LOF Baseline:Initiated  Goal status: Ongoing   2.  Increase knee strength to 5/5 and hip strength to 4+/5 to optimize functional mobility Baseline: see flow sheets Status:11/15/21= Knee ext 5/5 Goal status: Improving   3.  Pt will progress to Ind ambualtion with out an assist device on level/uneven surfaces and will ascend/descend steps indly in a reciprocal pattern and use of HR as needed Baseline: Needs a walker for ambulation and SBA on steps for safety  Status: 11/15/21=Walking without an AD Goal status:  Improving   4.  Improve 5xSTS by MCID of 5" and 2MWT by MCID of 71f as indication of improved functional mobility  Baseline: 5xSTS 24.5 " s use of hands; 2MWT TBA when pt can walk  without an AD. 11/29/21=[id number]: 11/15/21= 21.2" s use of hands Goal status: Improving   5.  Pt will demonstrate trunk ROM with only min limitations for optimize function Baseline: Restricted by LS brace Status: Gentle trunk rotation outside of back brace Goal status: Ongoing-tolerating     PLAN: PT FREQUENCY: 1x/week   PT DURATION: 8 weeks   PLANNED INTERVENTIONS: Therapeutic exercises, Therapeutic activity, Balance training, Gait training, Patient/Family education, Self Care, Aquatic Therapy, Dry Needling, Electrical stimulation, Cryotherapy, Moist heat, Taping, Manual therapy, and Re-evaluation   PLAN FOR NEXT SESSION:  assess response to HEP; progress therex as indicated; use of modalities, manual therapy; and TPDN as indicated. Did she get any updates on her restrictions? She is scheduled for aquatics next week.  Ayisha Pol MS, PT 11/29/21 5:56 PM

## 2021-11-30 ENCOUNTER — Encounter: Payer: Medicaid Other | Admitting: Physical Therapy

## 2021-12-01 ENCOUNTER — Ambulatory Visit: Payer: Medicaid Other | Admitting: Physical Therapy

## 2021-12-01 DIAGNOSIS — Z681 Body mass index (BMI) 19 or less, adult: Secondary | ICD-10-CM | POA: Diagnosis not present

## 2021-12-01 DIAGNOSIS — F17210 Nicotine dependence, cigarettes, uncomplicated: Secondary | ICD-10-CM | POA: Diagnosis not present

## 2021-12-01 DIAGNOSIS — Z20822 Contact with and (suspected) exposure to covid-19: Secondary | ICD-10-CM | POA: Diagnosis not present

## 2021-12-08 ENCOUNTER — Encounter: Payer: Self-pay | Admitting: Physical Therapy

## 2021-12-08 ENCOUNTER — Ambulatory Visit: Payer: Medicaid Other | Attending: Primary Care | Admitting: Physical Therapy

## 2021-12-08 DIAGNOSIS — R262 Difficulty in walking, not elsewhere classified: Secondary | ICD-10-CM | POA: Diagnosis not present

## 2021-12-08 DIAGNOSIS — R252 Cramp and spasm: Secondary | ICD-10-CM | POA: Insufficient documentation

## 2021-12-08 DIAGNOSIS — M5459 Other low back pain: Secondary | ICD-10-CM | POA: Diagnosis not present

## 2021-12-08 DIAGNOSIS — M6281 Muscle weakness (generalized): Secondary | ICD-10-CM | POA: Insufficient documentation

## 2021-12-08 NOTE — Therapy (Signed)
OUTPATIENT PHYSICAL THERAPY TREATMENT NOTE   Patient Name: Courtney Mendez MRN: [id number] DOB:10-25-1990, 31 y.o., female Today's Date: 12/09/2021  PCP: Kerin Perna, NP   REFERRING PROVIDER: Marvis Moeller, NP  END OF SESSION:   PT End of Session - 12/08/21 1447     Visit Number 10    Number of Visits 14    Date for PT Re-Evaluation 12/16/21    Authorization Type [place] [employer]ICA[employer] BLUE    Authorization Time Period Approved 7 PT visits from 10/25/2021-12/23/2021:  5 PT visits11/21/23-01/19/24    Authorization - Visit Number 10    Authorization - Number of Visits 13    Progress Note Due on Visit 10    PT Start Time 8101    PT Stop Time 1528    PT Time Calculation (min) 43 min    Equipment Utilized During Treatment Other (comment)   Soft back brace   Activity Tolerance Patient tolerated treatment well    Behavior During Therapy WFL for tasks assessed/performed               Past Medical History:  Diagnosis Date   Heart murmur    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   THERAPY DIAG:  Other low back pain  Difficulty in walking, not elsewhere classified  Muscle weakness (generalized)  Cramp and spasm  Rationale for Evaluation and Treatment Rehabilitation  PERTINENT HISTORY: Heart murmur   PRECAUTIONS: Back, no bending, arching, or lifting. Pt is wearing a thoracolumbar immobilization back brace which she can take off only to shower and when lying down. (11/02/21: saw MD - begin weaning from brace, ok for aquatics, no other updates, pt to call and  clarify)  SUBJECTIVE:                                                                                                                                                                                      SUBJECTIVE STATEMENT:  Pt reports that she is doing well today with near 0/10 pain.  She reports she is trying to get used to sleeping in her bed at night on her side.  PAIN:  Are you having pain? Yes: NPRS scale: 0/10 Pain location: Low back and upper gluteal, L >  R Pain description: tension, tightness Aggravating factors: prolonged standing and walking  Relieving factors: pain and muscle relaxer meds, heating pad, sitting, lying down  0-10/10 on eval   OBJECTIVE: (objective measures completed at initial evaluation unless otherwise dated)   DIAGNOSTIC FINDINGS:   Xray 08/27/21 FINDINGS: Three C-arm images of the upper lumbar spine demonstrate bilateral pedicle screw placement at the L1 and L3 levels and right pedicle screw placement at the L2 level. Normal alignment.   IMPRESSION: Operative changes, as described above.   PATIENT SURVEYS:  LEFS 19/80   COGNITION:           Overall cognitive status: Within functional limits for tasks assessed                          SENSATION: Intact, but pt reports her L leg will become numb intermittently, esp with prolonged sitting   EDEMA:  Not observed/palpated of the legs   MUSCLE LENGTH: Hamstrings: Right 70 deg; Left 68 deg Thomas test: Right NT deg; Left NT deg   POSTURE: forward head; due to LS back brace , unable to assess thoracic and lumbar posture   PALPATION: Pt was not TTP of the lumbar paraspinals   LUMBAR ROM:  NT due to post surgical back restrictions Active  A/PROM  10/14/2021  Flexion    Extension    Right lateral flexion    Left lateral flexion    Right rotation    Left rotation     (Blank rows = not tested)   LOWER EXTREMITY ROM:                       Grosssly WNLS Active ROM Right eval Left eval   Hip flexion      Hip extension      Hip abduction      Hip adduction      Hip internal rotation      Hip external rotation      Knee flexion      Knee extension      Ankle dorsiflexion      Ankle plantarflexion      Ankle inversion      Ankle eversion       (Blank rows = not tested)   LOWER EXTREMITY MMT:   MMT Right eval Left eval RT LT  Hip flexion 4+ 4+    Hip extension NT NT    Hip abduction 4 4    Hip adduction 5 5    Hip internal rotation 5 5    Hip external rotation 4+ 4+    Knee flexion _0 Knee extension 4+ 4+ 5 5  Ankle dorsiflexion 5 5    Ankle plantarflexion 5 5    Ankle inversion        Ankle eversion         (Blank rows = not tested)  11/29/21 Plank on knees 50"   LUMBAR SPECIAL TESTS:  Straight leg raise test: Negative   LOWER EXTREMITY SPECIAL TESTS:  None completed   FUNCTIONAL TESTS:  5 times sit to stand: 24.5' from bariatric chair s use of hands 6 minute walk test: TBA when pt is able to walk without an assist device 11/15/21 5xSTS= 21.2 sec 11/29/21 2MWT= 555 ft   GAIT: Distance walked: 200' Assistive device utilized: Environmental consultant - 4 wheeled Level of assistance: Modified independence Comments: Decreased pace, equall step length  TRANSFERS:                       -Ind with sit to stand transfer to mat table -Verbal instruction and CGA for sit to supine transfer c log rolling from side to supine. Pt returned to sitting from supine c verbal cueing only. Pt reports tolerating the transfer and lying supine without increase in pain.     TODAY'S TREATMENT:  TREATMENT 12/08/21:  Aquatic therapy at [address] - therapeutic pool temp 92 degrees Pt enters building independently.  Treatment took place in water 3.8 to  4 ft 8 in.feet deep depending upon activity.  Pt entered and exited the pool via stair and handrails    Aquatic Therapy:  Water walking for warm up  Stretching: Runners stretch on bottom step x30"  BIL Hamstring stretch on bottom step x30" BIL Figure 4 squat stretch, BIL UE support 2x30" BIL (NT)  At edge of pool, pt performed LE exercise: Lateral walking with rainbow dumbell adduction - 3 laps Walking march 3 laps with dumbell push down Dumbell squat Dumbell lunge Barbell press down in mini squat Kickboard thoracic rotation Kickboard push pull center/L/R Hamstring curl x20 BIL Shoulder abd then add adduction with Yellow DB with abdominal bracing - 20x Kickboard push pull with AB - 20x Hip abd/add circles x20 BIL Hip hinge EOB - 20x Squats x20 Step ups fwd and lat on submerged step 2x10 BIL   Pt requires the buoyancy of water for active assisted exercises with buoyancy supported for strengthening and AROM exercises. Hydrostatic pressure also supports joints by unweighting joint load by at least 50 % in 3-4 feet depth water. 80% in chest to neck deep water. Water will provide assistance with movement using the current and laminar flow while the buoyancy reduces weight bearing. Pt requires the viscosity of the water for resistance with strengthening exercises.   [facility] Adult PT Treatment:                                                DATE: 11/29/21 Therapeutic Exercise: Nustep L5 UE/LE x 6 min Gentle LTR bilat Gentle open book  bilat Posterior pelvic tilt x10 3" 90/90 abd heel taps 2x10 each Quadruped alt arm lifts x15 each Quadruped alt leg lifts x15 each Planks on knees x5 20", max attempt 50" Omega chest press 2x10 25# Omega pec press 2x10 25# Omega chest pull 2x10 25# Omegalat pull 2x10 20# 2MWT 541f  11/15/21 Therapeutic Exercise: Nustep L5 UE/LE x 6 min Standing shoulder row 2x15 GTB Standing shoulder ext 2x15 GTB Paloff  press c RTB x15 each Gentle LTR bilat Gentle open book  bilat Piriformis stretch 20" bilat Posterior pelvic tilt x15 3" 90/90 abd heel taps x10 each Standing QL stretch 2x 20" 5xSTS   Manual Therapy: STM and DTM to the low back  paraspinals and quadratus lumborum, L more so than the R Self care: For wife to provide STM to her low back for pain and muscle tension reduction  TREATMENT 11/17/21:  Aquatic therapy at [address] - therapeutic pool temp 92 degrees Pt enters building independently.  Treatment took place in water 3.8 to  4 ft 8 in.feet deep depending upon activity.  Pt entered and exited the pool via stair and handrails    Aquatic Therapy:  Water walking for warm up  Stretching: Runners stretch on bottom step x30" BIL Hamstring stretch on bottom step x30" BIL Figure 4 squat stretch, BIL UE support 2x30" BIL (NT)  At edge of pool, pt performed LE exercise: Lateral walking with rainbow dumbell adduction - 3 laps Walking march 3 laps with dumbell push down Hamstring curl x20 BIL Shoulder abd then add adduction with Yellow DB with abdominal bracing - 20x Kickboard push pull with AB - 20x Hip abd/add circles x20 BIL Hip hinge EOB - 20x Squats x20 Step ups fwd and lat on submerged step 2x10 BIL   Pt requires the buoyancy of water for active assisted exercises with buoyancy supported for strengthening and AROM exercises. Hydrostatic pressure also supports joints by unweighting joint load by at least 50 % in 3-4 feet depth water. 80% in chest to neck deep water. Water will provide assistance with movement using the current and laminar flow while the buoyancy reduces weight bearing. Pt requires the viscosity of the water for resistance with strengthening exercises.    HOME EXERCISE PROGRAM:   Access Code: [email or web address] URL: [email or web address] Date: 11/08/2021 Prepared by: Gar Ponto  Exercises - Sit to Stand with Arms Crossed  - 1-2 x daily - 7 x weekly - 1-2 sets - 10 reps - 3 hold - Heel Toe Raises with Counter Support  - 1-2 x daily - 7 x weekly - 1-2 sets - 10 reps - 3 hold - Hooklying Clamshell with Resistance  - 1-2 x daily - 7 x weekly - 1-2 sets - 10 reps - 3  hold - Standing Shoulder Row with Anchored Resistance  - 1 x daily - 7 x weekly - 3 sets - 10 reps - 3 hold - Shoulder extension with resistance - Neutral  - 1 x daily - 7 x weekly - 3 sets - 10 reps - 3 hold - Anti-Rotation Lateral Stepping with Press  - 1 x daily - 7 x weekly - 2 sets - 10 reps - 3 hold - Anti-Rotation Sidestepping with Resistance  - 1 x daily - 7 x weekly - 2 sets - 10 reps - 3 hold - Supine Posterior Pelvic Tilt  - 1 x daily - 7 x weekly - 1 sets - 10 reps - 5 hold - Supine 90/90 Alternating Heel Touches with Posterior Pelvic Tilt  - 1 x daily - 7 x weekly - 2 sets - 10 reps - 2 hold - Supine Lower Trunk Rotation  - 1 x daily - 7 x weekly - 1 sets - 10 reps - 5 hold - Sidelying Thoracic Rotation with Open Book  - 1 x daily - 7 x weekly - 1 sets - 10 reps - 5 hold - Supine Piriformis Stretch with Foot on Ground  - 1 x daily - 7 x weekly - 1 sets - 3 reps - 20 hold - Standing Quadratus Lumborum Stretch with Doorway  - 1 x daily - 7 x weekly - 1 sets - 3 reps - 20 hold    ASSESSMENT:   CLINICAL IMPRESSION: Session today focused on core and hip strength as well as overall activity tolerance in the aquatic environment for use of buoyancy to offload joints and the viscosity of water as resistance during therapeutic exercise.  We utilized aquatic bells for self perturbation in SLS for core strengthening to good effect.  Christene Lye is improving ability to complete higher level exercises with near 0/10 pain.  Patient was able to tolerate  all prescribed exercises in the aquatic environment with no adverse effects and reports 10/10 pain at the end of the session. Patient continues to benefit from skilled PT services on land and aquatic based and should be progressed as able to improve functional independence.    OBJECTIVE IMPAIRMENTS decreased activity tolerance, decreased balance, decreased endurance, decreased mobility, difficulty walking, decreased ROM, decreased strength, impaired  flexibility, and pain.    ACTIVITY LIMITATIONS carrying, lifting, bending, sitting, standing, squatting, sleeping, stairs, transfers, bed mobility, bathing, toileting, dressing, locomotion level, and caring for others   PARTICIPATION LIMITATIONS: meal prep, cleaning, laundry, driving, shopping, community activity, and school   PERSONAL FACTORS Time since onset of injury/illness/exacerbation are also affecting patient's functional outcome.    REHAB POTENTIAL: Good   CLINICAL DECISION MAKING: Evolving/moderate complexity   EVALUATION COMPLEXITY: Moderate     GOALS:   SHORT TERM GOALS: Target date: 11/04/2021  Pt will be Ind in an initial HEP Baseline:initiated Goal status: MET for current HEP 11/08/21   2.  Pt will voice understanding of measures to assist in pain reduction Baseline: Instruction in bed mobility and proper turning Status: 11/08/21=Use ot therex helps to decrease pain and tightness Goal status: MET   LONG TERM GOALS: Target date: 12/16/21   Pt will be Ind in a final HEP to maintain achieved LOF Baseline:Initiated  Goal status: Ongoing   2.  Increase knee strength to 5/5 and hip strength to 4+/5 to optimize functional mobility Baseline: see flow sheets Status:11/15/21= Knee ext 5/5 Goal status: Improving   3.  Pt will progress to Ind ambualtion with out an assist device on level/uneven surfaces and will ascend/descend steps indly in a reciprocal pattern and use of HR as needed Baseline: Needs a walker for ambulation and SBA on steps for safety  Status: 11/15/21=Walking without an AD Goal status: Improving   4.  Improve 5xSTS by MCID of 5" and 2MWT by MCID of 4f as indication of improved functional mobility  Baseline: 5xSTS 24.5 " s use of hands; 2MWT TBA when pt can walk without an AD. 11/29/21=[id number]: 11/15/21= 21.2" s use of hands Goal status: Improving   5.  Pt will demonstrate trunk ROM with only min limitations for optimize function Baseline:  Restricted by LS brace Status: Gentle trunk rotation outside of back brace Goal status: Ongoing-tolerating     PLAN: PT FREQUENCY: 1x/week   PT DURATION: 8 weeks   PLANNED INTERVENTIONS: Therapeutic exercises, Therapeutic activity, Balance training, Gait training, Patient/Family education, Self Care, Aquatic Therapy, Dry Needling, Electrical stimulation, Cryotherapy, Moist heat, Taping, Manual therapy, and Re-evaluation   PLAN FOR NEXT SESSION:  assess response to HEP; progress therex as indicated; use of modalities, manual therapy; and TPDN as indicated. Did she get any updates on her restrictions? She is scheduled for aquatics next week.  KaKevan Nyeinhartsen PT 12/09/21 7:21 AM

## 2021-12-14 ENCOUNTER — Ambulatory Visit: Payer: Medicaid Other | Admitting: Physical Therapy

## 2021-12-22 ENCOUNTER — Ambulatory Visit: Payer: Medicaid Other | Admitting: Physical Therapy

## 2021-12-22 ENCOUNTER — Encounter: Payer: Self-pay | Admitting: Physical Therapy

## 2021-12-22 DIAGNOSIS — M5459 Other low back pain: Secondary | ICD-10-CM | POA: Diagnosis not present

## 2021-12-22 DIAGNOSIS — R262 Difficulty in walking, not elsewhere classified: Secondary | ICD-10-CM

## 2021-12-22 DIAGNOSIS — M6281 Muscle weakness (generalized): Secondary | ICD-10-CM

## 2021-12-22 DIAGNOSIS — R252 Cramp and spasm: Secondary | ICD-10-CM | POA: Diagnosis not present

## 2021-12-22 NOTE — Therapy (Signed)
OUTPATIENT PHYSICAL THERAPY TREATMENT NOTE   Patient Name: Courtney Mendez MRN: [id number] DOB:1990/09/06, 31 y.o., female Today's Date: 12/22/2021  PCP: Kerin Perna, NP   REFERRING PROVIDER: Marvis Moeller, NP  END OF SESSION:   PT End of Session - 12/22/21 1536     Visit Number 11    Number of Visits 14    Date for PT Re-Evaluation 12/16/21    Authorization Type [place] MEDICAID HEALTHY BLUE    Authorization Time Period Approved 7 PT visits from 10/25/2021-12/23/2021:  5 PT visits11/21/23-01/19/24    Authorization - Number of Visits 13    Progress Note Due on Visit 10    PT Start Time 1535    PT Stop Time 1615    PT Time Calculation (min) 40 min    Equipment Utilized During Treatment Other (comment)   Soft back brace   Activity Tolerance Patient tolerated treatment well    Behavior During Therapy WFL for tasks assessed/performed               Past Medical History:  Diagnosis Date   Heart murmur    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   THERAPY DIAG:  Other low back pain  Difficulty in walking, not elsewhere classified  Muscle weakness (generalized)  Cramp and spasm  Rationale for Evaluation and Treatment Rehabilitation  PERTINENT HISTORY: Heart murmur   PRECAUTIONS: Back, no bending, arching, or lifting. Pt is wearing a thoracolumbar immobilization back brace which she can take off only to shower and when lying down. (11/02/21: saw MD - begin weaning from brace, ok for aquatics, no other updates, pt to call and clarify)  SUBJECTIVE:                                                                                                                                                                                       SUBJECTIVE STATEMENT:  Pt states she is feeling good and is overall having very little pain.  PAIN:  Are you having pain? Yes: NPRS scale: 0/10 Pain location: Low back and upper gluteal, L > R Pain description: tension, tightness Aggravating factors: prolonged standing and walking  Relieving factors: pain and muscle relaxer meds, heating pad, sitting, lying down  0-10/10 on eval   OBJECTIVE: (objective measures completed at initial evaluation unless otherwise dated)   DIAGNOSTIC FINDINGS:   Xray 08/27/21 FINDINGS: Three C-arm images of the upper lumbar spine demonstrate bilateral pedicle screw placement at the L1 and L3 levels and right pedicle screw placement at the L2 level. Normal alignment.   IMPRESSION: Operative changes, as described above.   PATIENT SURVEYS:  LEFS 19/80   COGNITION:           Overall cognitive status: Within functional limits for tasks assessed                          SENSATION: Intact, but pt reports her L leg will become numb intermittently, esp with prolonged sitting   EDEMA:  Not observed/palpated of the legs   MUSCLE LENGTH: Hamstrings: Right 70 deg; Left 68 deg Thomas test: Right NT deg; Left NT deg   POSTURE: forward head; due to LS back brace , unable to assess thoracic and lumbar posture   PALPATION: Pt was not TTP of the lumbar paraspinals   LUMBAR ROM:  NT due to post surgical back restrictions Active  A/PROM  10/14/2021  Flexion    Extension    Right lateral flexion    Left lateral flexion    Right rotation    Left rotation     (Blank rows = not tested)   LOWER EXTREMITY ROM:                       Grosssly WNLS Active ROM Right eval Left eval  Hip flexion      Hip extension      Hip abduction      Hip adduction      Hip internal rotation      Hip external  rotation      Knee flexion      Knee extension      Ankle dorsiflexion      Ankle plantarflexion      Ankle inversion      Ankle eversion       (Blank rows = not tested)   LOWER EXTREMITY MMT:   MMT Right eval Left eval RT LT  Hip flexion 4+ 4+    Hip extension NT NT    Hip abduction 4 4    Hip adduction 5 5    Hip internal rotation 5 5    Hip external rotation 4+ 4+    Knee flexion _0 Knee extension 4+ 4+ 5 5  Ankle dorsiflexion 5 5    Ankle plantarflexion 5 5    Ankle inversion        Ankle eversion         (Blank rows = not tested)  11/29/21 Plank on knees 50"   LUMBAR SPECIAL TESTS:  Straight leg raise test: Negative   LOWER EXTREMITY SPECIAL TESTS:  None completed   FUNCTIONAL TESTS:  5 times sit to stand: 24.5' from bariatric chair s use of hands 6 minute walk test: TBA when pt is able to walk without an assist device 11/15/21 5xSTS= 21.2 sec 11/29/21 2MWT= 555 ft   GAIT: Distance walked: 200' Assistive device utilized: Environmental consultant - 4 wheeled Level of assistance: Modified independence Comments: Decreased pace, equall step length   TRANSFERS:                       -Ind  with sit to stand transfer to mat table -Verbal instruction and CGA for sit to supine transfer c log rolling from side to supine. Pt returned to sitting from supine c verbal cueing only. Pt reports tolerating the transfer and lying supine without increase in pain.     TODAY'S TREATMENT:  TREATMENT 12/22/21:  Aquatic therapy at [address] - therapeutic pool temp 92 degrees Pt enters building independently.  Treatment took place in water 3.8 to  4 ft 8 in.feet deep depending upon activity.  Pt entered and exited the pool via stair and handrails    Aquatic Therapy:  Water walking for warm up  Stretching: Runners stretch on bottom step x30" BIL Hamstring stretch on bottom step x30" BIL Figure 4 squat stretch, BIL UE support 2x30" BIL (NT)  At edge of pool, pt  performed LE exercise: Lateral walking with rainbow dumbell adduction - 3 laps Walking march 3 laps with dumbell push down Dumbell squat Dumbell lunge Barbell press down in mini squat Seated balance on blue V board Kickboard thoracic rotation Kickboard push pull center/L/R Hamstring curl x20 BIL Shoulder abd then add adduction with Yellow DB with abdominal bracing - 20x Kickboard push pull with AB - 20x Hip abd/add circles x20 BIL Hip hinge EOB - 20x Squats x20 Step ups fwd and lat on submerged step 2x10 BIL   Pt requires the buoyancy of water for active assisted exercises with buoyancy supported for strengthening and AROM exercises. Hydrostatic pressure also supports joints by unweighting joint load by at least 50 % in 3-4 feet depth water. 80% in chest to neck deep water. Water will provide assistance with movement using the current and laminar flow while the buoyancy reduces weight bearing. Pt requires the viscosity of the water for resistance with strengthening exercises.   [facility] PT Treatment:                                                DATE: 11/29/21 Therapeutic Exercise: Nustep L5 UE/LE x 6 min Gentle LTR bilat Gentle open book  bilat Posterior pelvic tilt x10 3" 90/90 abd heel taps 2x10 each Quadruped alt arm lifts x15 each Quadruped alt leg lifts x15 each Planks on knees x5 20", max attempt 50" Omega chest press 2x10 25# Omega pec press 2x10 25# Omega chest pull 2x10 25# Omegalat pull 2x10 20# 2MWT 521f  11/15/21 Therapeutic Exercise: Nustep L5 UE/LE x 6 min Standing shoulder row 2x15 GTB Standing shoulder ext 2x15 GTB Paloff  press c RTB x15 each Gentle LTR bilat Gentle open book  bilat Piriformis stretch 20" bilat Posterior pelvic tilt x15 3" 90/90 abd heel taps x10 each Standing QL stretch 2x 20" 5xSTS   Manual Therapy: STM and DTM to the low back paraspinals and quadratus lumborum, L more so than the R Self care: For wife to provide STM to  her low back for pain and muscle tension reduction  TREATMENT 11/17/21:  Aquatic therapy at [address] - therapeutic pool temp 92 degrees Pt enters building independently.  Treatment took place in water 3.8 to  4 ft 8 in.feet deep depending upon activity.  Pt entered and exited the pool via stair and handrails    Aquatic Therapy:  Water walking for warm up  Stretching: Runners stretch on bottom step x30" BIL Hamstring stretch on  bottom step x30" BIL Figure 4 squat stretch, BIL UE support 2x30" BIL (NT)  At edge of pool, pt performed LE exercise: Lateral walking with rainbow dumbell adduction - 3 laps Walking march 3 laps with dumbell push down Hamstring curl x20 BIL Shoulder abd then add adduction with Yellow DB with abdominal bracing - 20x Kickboard push pull with AB - 20x Hip abd/add circles x20 BIL Hip hinge EOB - 20x Squats x20 Step ups fwd and lat on submerged step 2x10 BIL   Pt requires the buoyancy of water for active assisted exercises with buoyancy supported for strengthening and AROM exercises. Hydrostatic pressure also supports joints by unweighting joint load by at least 50 % in 3-4 feet depth water. 80% in chest to neck deep water. Water will provide assistance with movement using the current and laminar flow while the buoyancy reduces weight bearing. Pt requires the viscosity of the water for resistance with strengthening exercises.    HOME EXERCISE PROGRAM:   Access Code: [email or web address] URL: [email or web address] Date: 11/08/2021 Prepared by: Gar Ponto  Exercises - Sit to Stand with Arms Crossed  - 1-2 x daily - 7 x weekly - 1-2 sets - 10 reps - 3 hold - Heel Toe Raises with Counter Support  - 1-2 x daily - 7 x weekly - 1-2 sets - 10 reps - 3 hold - Hooklying Clamshell with Resistance  - 1-2 x daily - 7 x weekly - 1-2 sets - 10 reps - 3 hold - Standing Shoulder Row with Anchored Resistance  - 1 x daily - 7 x weekly - 3 sets - 10  reps - 3 hold - Shoulder extension with resistance - Neutral  - 1 x daily - 7 x weekly - 3 sets - 10 reps - 3 hold - Anti-Rotation Lateral Stepping with Press  - 1 x daily - 7 x weekly - 2 sets - 10 reps - 3 hold - Anti-Rotation Sidestepping with Resistance  - 1 x daily - 7 x weekly - 2 sets - 10 reps - 3 hold - Supine Posterior Pelvic Tilt  - 1 x daily - 7 x weekly - 1 sets - 10 reps - 5 hold - Supine 90/90 Alternating Heel Touches with Posterior Pelvic Tilt  - 1 x daily - 7 x weekly - 2 sets - 10 reps - 2 hold - Supine Lower Trunk Rotation  - 1 x daily - 7 x weekly - 1 sets - 10 reps - 5 hold - Sidelying Thoracic Rotation with Open Book  - 1 x daily - 7 x weekly - 1 sets - 10 reps - 5 hold - Supine Piriformis Stretch with Foot on Ground  - 1 x daily - 7 x weekly - 1 sets - 3 reps - 20 hold - Standing Quadratus Lumborum Stretch with Doorway  - 1 x daily - 7 x weekly - 1 sets - 3 reps - 20 hold    ASSESSMENT:   CLINICAL IMPRESSION: Session today focused on core and hip strength as well as overall activity tolerance in the aquatic environment for use of buoyancy to offload joints and the viscosity of water as resistance during therapeutic exercise.  Courtney Mendez is doing very well overall.  She is able to complete all exercises with no increase in pain.  She will likely be read for D/C next land based visit.  Patient was able to tolerate all prescribed exercises in the aquatic environment with no adverse effects and reports 0/10 pain  at the end of the session. Patient continues to benefit from skilled PT services on land and aquatic based and should be progressed as able to improve functional independence.    OBJECTIVE IMPAIRMENTS decreased activity tolerance, decreased balance, decreased endurance, decreased mobility, difficulty walking, decreased ROM, decreased strength, impaired flexibility, and pain.    ACTIVITY LIMITATIONS carrying, lifting, bending, sitting, standing, squatting, sleeping, stairs,  transfers, bed mobility, bathing, toileting, dressing, locomotion level, and caring for others   PARTICIPATION LIMITATIONS: meal prep, cleaning, laundry, driving, shopping, community activity, and school   PERSONAL FACTORS Time since onset of injury/illness/exacerbation are also affecting patient's functional outcome.    REHAB POTENTIAL: Good   CLINICAL DECISION MAKING: Evolving/moderate complexity   EVALUATION COMPLEXITY: Moderate     GOALS:   SHORT TERM GOALS: Target date: 11/04/2021  Pt will be Ind in an initial HEP Baseline:initiated Goal status: MET for current HEP 11/08/21   2.  Pt will voice understanding of measures to assist in pain reduction Baseline: Instruction in bed mobility and proper turning Status: 11/08/21=Use ot therex helps to decrease pain and tightness Goal status: MET   LONG TERM GOALS: Target date: 12/16/21   Pt will be Ind in a final HEP to maintain achieved LOF Baseline:Initiated  Goal status: Ongoing   2.  Increase knee strength to 5/5 and hip strength to 4+/5 to optimize functional mobility Baseline: see flow sheets Status:11/15/21= Knee ext 5/5 Goal status: Improving   3.  Pt will progress to Ind ambualtion with out an assist device on level/uneven surfaces and will ascend/descend steps indly in a reciprocal pattern and use of HR as needed Baseline: Needs a walker for ambulation and SBA on steps for safety  Status: 11/15/21=Walking without an AD Goal status: Improving   4.  Improve 5xSTS by MCID of 5" and 2MWT by MCID of 28f as indication of improved functional mobility  Baseline: 5xSTS 24.5 " s use of hands; 2MWT TBA when pt can walk without an AD. 11/29/21=[id number]: 11/15/21= 21.2" s use of hands Goal status: Improving   5.  Pt will demonstrate trunk ROM with only min limitations for optimize function Baseline: Restricted by LS brace Status: Gentle trunk rotation outside of back brace Goal status: Ongoing-tolerating     PLAN: PT  FREQUENCY: 1x/week   PT DURATION: 8 weeks   PLANNED INTERVENTIONS: Therapeutic exercises, Therapeutic activity, Balance training, Gait training, Patient/Family education, Self Care, Aquatic Therapy, Dry Needling, Electrical stimulation, Cryotherapy, Moist heat, Taping, Manual therapy, and Re-evaluation   PLAN FOR NEXT SESSION:  assess response to HEP; progress therex as indicated; use of modalities, manual therapy; and TPDN as indicated. Did she get any updates on her restrictions? She is scheduled for aquatics next week.  KaKevan Nyeinhartsen PT 12/22/21 4:20 PM

## 2022-01-10 ENCOUNTER — Ambulatory Visit: Payer: Medicaid Other

## 2022-01-10 NOTE — Therapy (Incomplete)
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   THERAPY DIAG:  No diagnosis found.  Rationale for Evaluation and Treatment Rehabilitation  PERTINENT HISTORY: Heart murmur   PRECAUTIONS: Back, no bending, arching, or lifting. Pt is wearing a thoracolumbar immobilization back brace which she can take off only to shower and when lying down. (11/02/21: saw MD - begin weaning from brace, ok for aquatics, no other updates, pt to call and clarify)  SUBJECTIVE:                                                                                                                                                                                      SUBJECTIVE STATEMENT:  Pt states she is feeling good and is overall having very little pain.  PAIN:  Are you having pain? Yes: NPRS scale: 0/10 Pain location: Low back and upper gluteal, L > R Pain description: tension, tightness Aggravating factors: prolonged standing and walking  Relieving factors: pain and muscle relaxer meds, heating pad, sitting, lying down  0-10/10 on eval   OBJECTIVE: (objective measures completed at initial evaluation unless otherwise dated)   DIAGNOSTIC FINDINGS:   Xray 08/27/21 FINDINGS: Three C-arm images of the upper lumbar spine  demonstrate bilateral pedicle screw placement at the L1 and L3 levels and right pedicle screw placement at the L2 level. Normal alignment.   IMPRESSION: Operative changes, as described above.   PATIENT SURVEYS:  LEFS 19/80   COGNITION:           Overall cognitive status: Within functional limits for tasks assessed                          SENSATION: Intact, but pt reports her L leg will become numb intermittently, esp with prolonged sitting   EDEMA:  Not observed/palpated of the legs   MUSCLE LENGTH: Hamstrings: Right 70 deg; Left 68 deg Marcello Moores  test: Right NT deg; Left NT deg   POSTURE: forward head; due to LS back brace , unable to assess thoracic and lumbar posture   PALPATION: Pt was not TTP of the lumbar paraspinals   LUMBAR ROM:  NT due to post surgical back restrictions Active  A/PROM  10/14/2021  Flexion    Extension    Right lateral flexion    Left lateral flexion    Right rotation    Left rotation     (Blank rows = not tested)   LOWER EXTREMITY ROM:                       Grosssly WNLS Active ROM Right eval Left eval  Hip flexion      Hip extension      Hip abduction      Hip adduction      Hip internal rotation      Hip external rotation      Knee flexion      Knee extension      Ankle dorsiflexion      Ankle plantarflexion      Ankle inversion      Ankle eversion       (Blank rows = not tested)   LOWER EXTREMITY MMT:   MMT Right eval Left eval RT LT  Hip flexion 4+ 4+    Hip extension NT NT    Hip abduction 4 4    Hip adduction 5 5    Hip internal rotation 5 5    Hip external rotation 4+ 4+    Knee flexion 5 5 5 5   Knee extension 4+ 4+ 5 5  Ankle dorsiflexion 5 5    Ankle plantarflexion 5 5    Ankle inversion        Ankle eversion         (Blank rows = not tested)  11/29/21 Plank on knees 50"   LUMBAR SPECIAL TESTS:  Straight leg raise test: Negative   LOWER EXTREMITY SPECIAL TESTS:  None completed   FUNCTIONAL TESTS:  5  times sit to stand: 24.5' from bariatric chair s use of hands 6 minute walk test: TBA when pt is able to walk without an assist device 11/15/21 5xSTS= 21.2 sec 11/29/21 2MWT= 555 ft   GAIT: Distance walked: 200' Assistive device utilized: 12/01/21 - 4 wheeled Level of assistance: Modified independence Comments: Decreased pace, equall step length   TRANSFERS:                       -Ind with sit to stand transfer to mat table -Verbal instruction and CGA for sit to supine transfer c log rolling from side to supine. Pt returned to sitting from supine c verbal cueing only. Pt reports tolerating the transfer and lying supine without increase in pain.     TODAY'S TREATMENT: [facility] Adult PT Treatment:                                                DATE: 01/10/22 Therapeutic Exercise: *** Manual Therapy: *** Neuromuscular re-ed: *** Therapeutic Activity: *** Modalities: *** Self Care: ***  TREATMENT 12/22/21:  Aquatic therapy at [facility]- [address] - therapeutic pool temp 92 degrees Pt enters building independently.  Treatment took place in water 3.8 to  4  ft 8 in.feet deep depending upon activity.  Pt entered and exited the pool via stair and handrails    Aquatic Therapy:  Water walking for warm up  Stretching: Runners stretch on bottom step x30" BIL Hamstring stretch on bottom step x30" BIL Figure 4 squat stretch, BIL UE support 2x30" BIL (NT)  At edge of pool, pt performed LE exercise: Lateral walking with rainbow dumbell adduction - 3 laps Walking march 3 laps with dumbell push down Dumbell squat Dumbell lunge Barbell press down in mini squat Seated balance on blue V board Kickboard thoracic rotation Kickboard push pull center/L/R Hamstring curl x20 BIL Shoulder abd then add adduction with Yellow DB with abdominal bracing - 20x Kickboard push pull with AB - 20x Hip abd/add circles x20 BIL Hip hinge EOB - 20x Squats x20 Step ups fwd and lat on submerged step  2x10 BIL   Pt requires the buoyancy of water for active assisted exercises with buoyancy supported for strengthening and AROM exercises. Hydrostatic pressure also supports joints by unweighting joint load by at least 50 % in 3-4 feet depth water. 80% in chest to neck deep water. Water will provide assistance with movement using the current and laminar flow while the buoyancy reduces weight bearing. Pt requires the viscosity of the water for resistance with strengthening exercises.   [facility] [facility] Adult PT Treatment:                                                DATE: 11/29/21 Therapeutic Exercise: Nustep L5 UE/LE x 6 min Gentle LTR bilat Gentle open book  bilat Posterior pelvic tilt x10 3" 90/90 abd heel taps 2x10 each Quadruped alt arm lifts x15 each Quadruped alt leg lifts x15 each Planks on knees x5 20", max attempt 50" Omega chest press 2x10 25# Omega pec press 2x10 25# Omega chest pull 2x10 25# Omegalat pull 2x10 20# 2MWT 59ft  11/15/21 Therapeutic Exercise: Nustep L5 UE/LE x 6 min Standing shoulder row 2x15 GTB Standing shoulder ext 2x15 GTB Paloff  press c RTB x15 each Gentle LTR bilat Gentle open book  bilat Piriformis stretch 20" bilat Posterior pelvic tilt x15 3" 90/90 abd heel taps x10 each Standing QL stretch 2x 20" 5xSTS   Manual Therapy: STM and DTM to the low back paraspinals and quadratus lumborum, L more so than the R Self care: For wife to provide STM to her low back for pain and muscle tension reduction   HOME EXERCISE PROGRAM:   Access Code: [email or web address] URL: [email or web address] Date: 11/08/2021 Prepared by: Gar Ponto  Exercises - Sit to Stand with Arms Crossed  - 1-2 x daily - 7 x weekly - 1-2 sets - 10 reps - 3 hold - Heel Toe Raises with Counter Support  - 1-2 x daily - 7 x weekly - 1-2 sets - 10 reps - 3 hold - Hooklying Clamshell with Resistance  - 1-2 x daily - 7 x weekly - 1-2 sets - 10 reps - 3 hold - Standing Shoulder Row with  Anchored Resistance  - 1 x daily - 7 x weekly - 3 sets - 10 reps - 3 hold - Shoulder extension with resistance - Neutral  - 1 x daily - 7 x weekly - 3 sets - 10 reps - 3 hold - Anti-Rotation Lateral Stepping with Press  - 1 x daily -  7 x weekly - 2 sets - 10 reps - 3 hold - Anti-Rotation Sidestepping with Resistance  - 1 x daily - 7 x weekly - 2 sets - 10 reps - 3 hold - Supine Posterior Pelvic Tilt  - 1 x daily - 7 x weekly - 1 sets - 10 reps - 5 hold - Supine 90/90 Alternating Heel Touches with Posterior Pelvic Tilt  - 1 x daily - 7 x weekly - 2 sets - 10 reps - 2 hold - Supine Lower Trunk Rotation  - 1 x daily - 7 x weekly - 1 sets - 10 reps - 5 hold - Sidelying Thoracic Rotation with Open Book  - 1 x daily - 7 x weekly - 1 sets - 10 reps - 5 hold - Supine Piriformis Stretch with Foot on Ground  - 1 x daily - 7 x weekly - 1 sets - 3 reps - 20 hold - Standing Quadratus Lumborum Stretch with Doorway  - 1 x daily - 7 x weekly - 1 sets - 3 reps - 20 hold    ASSESSMENT:   CLINICAL IMPRESSION: Session today focused on core and hip strength as well as overall activity tolerance in the aquatic environment for use of buoyancy to offload joints and the viscosity of water as resistance during therapeutic exercise.  Waylon is doing very well overall.  She is able to complete all exercises with no increase in pain.  She will likely be read for D/C next land based visit.  Patient was able to tolerate all prescribed exercises in the aquatic environment with no adverse effects and reports 0/10 pain at the end of the session. Patient continues to benefit from skilled PT services on land and aquatic based and should be progressed as able to improve functional independence.    OBJECTIVE IMPAIRMENTS decreased activity tolerance, decreased balance, decreased endurance, decreased mobility, difficulty walking, decreased ROM, decreased strength, impaired flexibility, and pain.    ACTIVITY LIMITATIONS carrying,  lifting, bending, sitting, standing, squatting, sleeping, stairs, transfers, bed mobility, bathing, toileting, dressing, locomotion level, and caring for others   PARTICIPATION LIMITATIONS: meal prep, cleaning, laundry, driving, shopping, community activity, and school   PERSONAL FACTORS Time since onset of injury/illness/exacerbation are also affecting patient's functional outcome.    REHAB POTENTIAL: Good   CLINICAL DECISION MAKING: Evolving/moderate complexity   EVALUATION COMPLEXITY: Moderate     GOALS:   SHORT TERM GOALS: Target date: 11/04/2021  Pt will be Ind in an initial HEP Baseline:initiated Goal status: MET for current HEP 11/08/21   2.  Pt will voice understanding of measures to assist in pain reduction Baseline: Instruction in bed mobility and proper turning Status: 11/08/21=Use ot therex helps to decrease pain and tightness Goal status: MET   LONG TERM GOALS: Target date: 12/16/21   Pt will be Ind in a final HEP to maintain achieved LOF Baseline:Initiated  Goal status: Ongoing   2.  Increase knee strength to 5/5 and hip strength to 4+/5 to optimize functional mobility Baseline: see flow sheets Status:11/15/21= Knee ext 5/5 Goal status: Improving   3.  Pt will progress to Ind ambualtion with out an assist device on level/uneven surfaces and will ascend/descend steps indly in a reciprocal pattern and use of HR as needed Baseline: Needs a walker for ambulation and SBA on steps for safety  Status: 11/15/21=Walking without an AD Goal status: Improving   4.  Improve 5xSTS by MCID of 5" and by MCID of 69ft as  indication of improved functional mobility  Baseline: 5xSTS 24.5 " s use of hands; TBA when pt can walk without an AD. 11/29/21=569ft Status: 11/15/21= 21.2" s use of hands Goal status: Improving   5.  Pt will demonstrate trunk ROM with only min limitations for optimize function Baseline: Restricted by LS brace Status: Gentle trunk rotation outside  of back brace Goal status: Ongoing-tolerating     PLAN: PT FREQUENCY: 1x/week   PT DURATION: 8 weeks   PLANNED INTERVENTIONS: Therapeutic exercises, Therapeutic activity, Balance training, Gait training, Patient/Family education, Self Care, Aquatic Therapy, Dry Needling, Electrical stimulation, Cryotherapy, Moist heat, Taping, Manual therapy, and Re-evaluation   PLAN FOR NEXT SESSION:  assess response to HEP; progress therex as indicated; use of modalities, manual therapy; and TPDN as indicated. Did she get any updates on her restrictions? She is scheduled for aquatics next week.  Freida Busman Karanvir Balderston MS, PT 01/10/22 6:13 AM

## 2022-01-13 DIAGNOSIS — M545 Low back pain, unspecified: Secondary | ICD-10-CM | POA: Diagnosis not present

## 2022-01-13 DIAGNOSIS — Z681 Body mass index (BMI) 19 or less, adult: Secondary | ICD-10-CM | POA: Diagnosis not present

## 2022-01-13 DIAGNOSIS — S32028D Other fracture of second lumbar vertebra, subsequent encounter for fracture with routine healing: Secondary | ICD-10-CM | POA: Diagnosis not present

## 2022-01-13 DIAGNOSIS — Z79899 Other long term (current) drug therapy: Secondary | ICD-10-CM | POA: Diagnosis not present

## 2022-01-13 DIAGNOSIS — E559 Vitamin D deficiency, unspecified: Secondary | ICD-10-CM | POA: Diagnosis not present

## 2022-01-13 DIAGNOSIS — R5383 Other fatigue: Secondary | ICD-10-CM | POA: Diagnosis not present

## 2022-01-13 DIAGNOSIS — E7800 Pure hypercholesterolemia, unspecified: Secondary | ICD-10-CM | POA: Diagnosis not present

## 2022-01-13 DIAGNOSIS — F32A Depression, unspecified: Secondary | ICD-10-CM | POA: Diagnosis not present

## 2022-01-13 DIAGNOSIS — M129 Arthropathy, unspecified: Secondary | ICD-10-CM | POA: Diagnosis not present

## 2022-01-13 DIAGNOSIS — M542 Cervicalgia: Secondary | ICD-10-CM | POA: Diagnosis not present

## 2022-01-13 DIAGNOSIS — F5101 Primary insomnia: Secondary | ICD-10-CM | POA: Diagnosis not present

## 2022-01-17 DIAGNOSIS — Z79899 Other long term (current) drug therapy: Secondary | ICD-10-CM | POA: Diagnosis not present

## 2022-01-17 NOTE — Therapy (Incomplete)
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   THERAPY DIAG:  No diagnosis found.  Rationale for Evaluation and Treatment Rehabilitation  PERTINENT HISTORY: Heart murmur   PRECAUTIONS: Back, no bending, arching, or lifting. Pt is wearing a thoracolumbar immobilization back brace which she can take off only to shower and when lying down. (11/02/21: saw MD - begin weaning from brace, ok for aquatics, no other updates, pt to call and clarify)  SUBJECTIVE:                                                                                                                                                                                      SUBJECTIVE STATEMENT:  Pt states she is feeling good and is overall having very little pain.  PAIN:  Are you having pain? Yes: NPRS scale: 0/10 Pain location: Low back and upper gluteal, L > R Pain description: tension, tightness Aggravating factors: prolonged standing and walking  Relieving factors: pain and muscle relaxer meds, heating pad, sitting, lying down  0-10/10 on eval   OBJECTIVE: (objective measures completed at initial evaluation unless otherwise dated)   DIAGNOSTIC FINDINGS:   Xray 08/27/21 FINDINGS: Three C-arm images of the upper lumbar spine  demonstrate bilateral pedicle screw placement at the L1 and L3 levels and right pedicle screw placement at the L2 level. Normal alignment.   IMPRESSION: Operative changes, as described above.   PATIENT SURVEYS:  LEFS 19/80   COGNITION:           Overall cognitive status: Within functional limits for tasks assessed                          SENSATION: Intact, but pt reports her L leg will become numb intermittently, esp with prolonged sitting   EDEMA:  Not observed/palpated of the legs   MUSCLE LENGTH: Hamstrings: Right 70 deg; Left 68 deg Marcello Moores  test: Right NT deg; Left NT deg   POSTURE: forward head; due to LS back brace , unable to assess thoracic and lumbar posture   PALPATION: Pt was not TTP of the lumbar paraspinals   LUMBAR ROM:  NT due to post surgical back restrictions Active  A/PROM  10/14/2021  Flexion    Extension    Right lateral flexion    Left lateral flexion    Right rotation    Left rotation     (Blank rows = not tested)   LOWER EXTREMITY ROM:                       Grosssly WNLS Active ROM Right eval Left eval  Hip flexion      Hip extension      Hip abduction      Hip adduction      Hip internal rotation      Hip external rotation      Knee flexion      Knee extension      Ankle dorsiflexion      Ankle plantarflexion      Ankle inversion      Ankle eversion       (Blank rows = not tested)   LOWER EXTREMITY MMT:   MMT Right eval Left eval RT LT  Hip flexion 4+ 4+    Hip extension NT NT    Hip abduction 4 4    Hip adduction 5 5    Hip internal rotation 5 5    Hip external rotation 4+ 4+    Knee flexion 5 5 5 5   Knee extension 4+ 4+ 5 5  Ankle dorsiflexion 5 5    Ankle plantarflexion 5 5    Ankle inversion        Ankle eversion         (Blank rows = not tested)  11/29/21 Plank on knees 50"   LUMBAR SPECIAL TESTS:  Straight leg raise test: Negative   LOWER EXTREMITY SPECIAL TESTS:  None completed   FUNCTIONAL TESTS:  5  times sit to stand: 24.5' from bariatric chair s use of hands 6 minute walk test: TBA when pt is able to walk without an assist device 11/15/21 5xSTS= 21.2 sec 11/29/21 2MWT= 555 ft   GAIT: Distance walked: 200' Assistive device utilized: 12/01/21 - 4 wheeled Level of assistance: Modified independence Comments: Decreased pace, equall step length   TRANSFERS:                       -Ind with sit to stand transfer to mat table -Verbal instruction and CGA for sit to supine transfer c log rolling from side to supine. Pt returned to sitting from supine c verbal cueing only. Pt reports tolerating the transfer and lying supine without increase in pain.     TODAY'S TREATMENT: [facility] Adult PT Treatment:                                                DATE: 01/18/22 Therapeutic Exercise: *** Manual Therapy: *** Neuromuscular re-ed: *** Therapeutic Activity: *** Modalities: *** Self Care: ***  TREATMENT 12/22/21:  Aquatic therapy at [facility]- [address] - therapeutic pool temp 92 degrees Pt enters building independently.  Treatment took place in water 3.8 to  4  ft 8 in.feet deep depending upon activity.  Pt entered and exited the pool via stair and handrails    Aquatic Therapy:  Water walking for warm up  Stretching: Runners stretch on bottom step x30" BIL Hamstring stretch on bottom step x30" BIL Figure 4 squat stretch, BIL UE support 2x30" BIL (NT)  At edge of pool, pt performed LE exercise: Lateral walking with rainbow dumbell adduction - 3 laps Walking march 3 laps with dumbell push down Dumbell squat Dumbell lunge Barbell press down in mini squat Seated balance on blue V board Kickboard thoracic rotation Kickboard push pull center/L/R Hamstring curl x20 BIL Shoulder abd then add adduction with Yellow DB with abdominal bracing - 20x Kickboard push pull with AB - 20x Hip abd/add circles x20 BIL Hip hinge EOB - 20x Squats x20 Step ups fwd and lat on submerged  step 2x10 BIL   Pt requires the buoyancy of water for active assisted exercises with buoyancy supported for strengthening and AROM exercises. Hydrostatic pressure also supports joints by unweighting joint load by at least 50 % in 3-4 feet depth water. 80% in chest to neck deep water. Water will provide assistance with movement using the current and laminar flow while the buoyancy reduces weight bearing. Pt requires the viscosity of the water for resistance with strengthening exercises.   [facility] Adult PT Treatment:                                                DATE: 11/29/21 Therapeutic Exercise: Nustep L5 UE/LE x 6 min Gentle LTR bilat Gentle open book  bilat Posterior pelvic tilt x10 3" 90/90 abd heel taps 2x10 each Quadruped alt arm lifts x15 each Quadruped alt leg lifts x15 each Planks on knees x5 20", max attempt 50" Omega chest press 2x10 25# Omega pec press 2x10 25# Omega chest pull 2x10 25# Omegalat pull 2x10 20# 2MWT 579ft  11/15/21 Therapeutic Exercise: Nustep L5 UE/LE x 6 min Standing shoulder row 2x15 GTB Standing shoulder ext 2x15 GTB Paloff  press c RTB x15 each Gentle LTR bilat Gentle open book  bilat Piriformis stretch 20" bilat Posterior pelvic tilt x15 3" 90/90 abd heel taps x10 each Standing QL stretch 2x 20" 5xSTS   Manual Therapy: STM and DTM to the low back paraspinals and quadratus lumborum, L more so than the R Self care: For wife to provide STM to her low back for pain and muscle tension reduction   HOME EXERCISE PROGRAM:   Access Code: [email or web address] URL: [email or web address] Date: 11/08/2021 Prepared by: Gar Ponto  Exercises - Sit to Stand with Arms Crossed  - 1-2 x daily - 7 x weekly - 1-2 sets - 10 reps - 3 hold - Heel Toe Raises with Counter Support  - 1-2 x daily - 7 x weekly - 1-2 sets - 10 reps - 3 hold - Hooklying Clamshell with Resistance  - 1-2 x daily - 7 x weekly - 1-2 sets - 10 reps - 3 hold - Standing Shoulder Row  with Anchored Resistance  - 1 x daily - 7 x weekly - 3 sets - 10 reps - 3 hold - Shoulder extension with resistance - Neutral  - 1 x daily - 7 x weekly - 3 sets - 10 reps - 3 hold - Anti-Rotation Lateral Stepping with Press  - 1 x daily -  7 x weekly - 2 sets - 10 reps - 3 hold - Anti-Rotation Sidestepping with Resistance  - 1 x daily - 7 x weekly - 2 sets - 10 reps - 3 hold - Supine Posterior Pelvic Tilt  - 1 x daily - 7 x weekly - 1 sets - 10 reps - 5 hold - Supine 90/90 Alternating Heel Touches with Posterior Pelvic Tilt  - 1 x daily - 7 x weekly - 2 sets - 10 reps - 2 hold - Supine Lower Trunk Rotation  - 1 x daily - 7 x weekly - 1 sets - 10 reps - 5 hold - Sidelying Thoracic Rotation with Open Book  - 1 x daily - 7 x weekly - 1 sets - 10 reps - 5 hold - Supine Piriformis Stretch with Foot on Ground  - 1 x daily - 7 x weekly - 1 sets - 3 reps - 20 hold - Standing Quadratus Lumborum Stretch with Doorway  - 1 x daily - 7 x weekly - 1 sets - 3 reps - 20 hold    ASSESSMENT:   CLINICAL IMPRESSION: Session today focused on core and hip strength as well as overall activity tolerance in the aquatic environment for use of buoyancy to offload joints and the viscosity of water as resistance during therapeutic exercise.  Crescent is doing very well overall.  She is able to complete all exercises with no increase in pain.  She will likely be read for D/C next land based visit.  Patient was able to tolerate all prescribed exercises in the aquatic environment with no adverse effects and reports 0/10 pain at the end of the session. Patient continues to benefit from skilled PT services on land and aquatic based and should be progressed as able to improve functional independence.    OBJECTIVE IMPAIRMENTS decreased activity tolerance, decreased balance, decreased endurance, decreased mobility, difficulty walking, decreased ROM, decreased strength, impaired flexibility, and pain.    ACTIVITY LIMITATIONS carrying,  lifting, bending, sitting, standing, squatting, sleeping, stairs, transfers, bed mobility, bathing, toileting, dressing, locomotion level, and caring for others   PARTICIPATION LIMITATIONS: meal prep, cleaning, laundry, driving, shopping, community activity, and school   PERSONAL FACTORS Time since onset of injury/illness/exacerbation are also affecting patient's functional outcome.    REHAB POTENTIAL: Good   CLINICAL DECISION MAKING: Evolving/moderate complexity   EVALUATION COMPLEXITY: Moderate     GOALS:   SHORT TERM GOALS: Target date: 11/04/2021  Pt will be Ind in an initial HEP Baseline:initiated Goal status: MET for current HEP 11/08/21   2.  Pt will voice understanding of measures to assist in pain reduction Baseline: Instruction in bed mobility and proper turning Status: 11/08/21=Use ot therex helps to decrease pain and tightness Goal status: MET   LONG TERM GOALS: Target date: 12/16/21   Pt will be Ind in a final HEP to maintain achieved LOF Baseline:Initiated  Goal status: Ongoing   2.  Increase knee strength to 5/5 and hip strength to 4+/5 to optimize functional mobility Baseline: see flow sheets Status:11/15/21= Knee ext 5/5 Goal status: Improving   3.  Pt will progress to Ind ambualtion with out an assist device on level/uneven surfaces and will ascend/descend steps indly in a reciprocal pattern and use of HR as needed Baseline: Needs a walker for ambulation and SBA on steps for safety  Status: 11/15/21=Walking without an AD Goal status: Improving   4.  Improve 5xSTS by MCID of 5" and by MCID of 82ft as  indication of improved functional mobility  Baseline: 5xSTS 24.5 " s use of hands; TBA when pt can walk without an AD. 11/29/21=569ft Status: 11/15/21= 21.2" s use of hands Goal status: Improving   5.  Pt will demonstrate trunk ROM with only min limitations for optimize function Baseline: Restricted by LS brace Status: Gentle trunk rotation outside  of back brace Goal status: Ongoing-tolerating     PLAN: PT FREQUENCY: 1x/week   PT DURATION: 8 weeks   PLANNED INTERVENTIONS: Therapeutic exercises, Therapeutic activity, Balance training, Gait training, Patient/Family education, Self Care, Aquatic Therapy, Dry Needling, Electrical stimulation, Cryotherapy, Moist heat, Taping, Manual therapy, and Re-evaluation   PLAN FOR NEXT SESSION:  assess response to HEP; progress therex as indicated; use of modalities, manual therapy; and TPDN as indicated. Did she get any updates on her restrictions? She is scheduled for aquatics next week.  Freida Busman Timofey Carandang MS, PT 01/10/22 6:13 AM

## 2022-01-18 ENCOUNTER — Ambulatory Visit: Payer: Medicaid Other | Attending: Primary Care

## 2022-01-18 ENCOUNTER — Telehealth: Payer: Self-pay

## 2022-01-18 NOTE — Telephone Encounter (Signed)
LVM re: no show appt. Advised this was her last schedule appt and she can call to reschedule as needed. Also, advised pt she would be Dced from PT services in 2 weeks if no further communication is received.

## 2022-02-15 DIAGNOSIS — R030 Elevated blood-pressure reading, without diagnosis of hypertension: Secondary | ICD-10-CM | POA: Diagnosis not present

## 2022-02-15 DIAGNOSIS — Z79899 Other long term (current) drug therapy: Secondary | ICD-10-CM | POA: Diagnosis not present

## 2022-02-15 DIAGNOSIS — R5383 Other fatigue: Secondary | ICD-10-CM | POA: Diagnosis not present

## 2022-02-15 DIAGNOSIS — M542 Cervicalgia: Secondary | ICD-10-CM | POA: Diagnosis not present

## 2022-02-15 DIAGNOSIS — M129 Arthropathy, unspecified: Secondary | ICD-10-CM | POA: Diagnosis not present

## 2022-02-15 DIAGNOSIS — F5101 Primary insomnia: Secondary | ICD-10-CM | POA: Diagnosis not present

## 2022-02-15 DIAGNOSIS — M545 Low back pain, unspecified: Secondary | ICD-10-CM | POA: Diagnosis not present

## 2022-02-15 DIAGNOSIS — E7800 Pure hypercholesterolemia, unspecified: Secondary | ICD-10-CM | POA: Diagnosis not present

## 2022-02-15 DIAGNOSIS — Z131 Encounter for screening for diabetes mellitus: Secondary | ICD-10-CM | POA: Diagnosis not present

## 2022-02-15 DIAGNOSIS — F17200 Nicotine dependence, unspecified, uncomplicated: Secondary | ICD-10-CM | POA: Diagnosis not present

## 2022-02-15 DIAGNOSIS — E559 Vitamin D deficiency, unspecified: Secondary | ICD-10-CM | POA: Diagnosis not present

## 2022-02-15 DIAGNOSIS — F17210 Nicotine dependence, cigarettes, uncomplicated: Secondary | ICD-10-CM | POA: Diagnosis not present

## 2022-02-15 DIAGNOSIS — Z1159 Encounter for screening for other viral diseases: Secondary | ICD-10-CM | POA: Diagnosis not present

## 2022-02-15 DIAGNOSIS — F32A Depression, unspecified: Secondary | ICD-10-CM | POA: Diagnosis not present

## 2022-03-16 ENCOUNTER — Ambulatory Visit: Payer: Medicaid Other

## 2022-04-04 NOTE — Therapy (Deleted)
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  ONSET DATE: ***  SUBJECTIVE:                                                                                                                                                                                           SUBJECTIVE STATEMENT: ***  PERTINENT HISTORY:    EXAM: LUMBAR SPINE - 2-3 VIEW   COMPARISON:  Lumbar spine CT and lumbar spine MRI obtained earlier today.   FINDINGS: Three C-arm images of the upper lumbar spine demonstrate bilateral pedicle screw placement at the L1 and L3 levels and right pedicle screw placement at the L2 level. Normal alignment.   IMPRESSION: Operative changes, as described above.     Electronically Signed   By: Claudie Revering M.D.   On: 08/27/2021 15:03  PAIN:  Are you having pain? {OPRCPAIN:27236}  PRECAUTIONS: None  WEIGHT BEARING RESTRICTIONS: No  FALLS:  Has patient fallen in last 6 months? No  LIVING ENVIRONMENT: Lives with: {OPRC lives with:25569::"lives with their family"} Lives in: {Lives in:25570} Stairs: {opstairs:27293} Has following equipment at home: {Assistive devices:23999}  OCCUPATION: ***  PLOF: Independent  PATIENT GOALS: ***  NEXT MD VISIT:  ***  OBJECTIVE:   DIAGNOSTIC FINDINGS:  CLINICAL DATA:  Lumbar spine surgery for L2 fractures seen earlier today.   EXAM: LUMBAR SPINE - 2-3 VIEW   COMPARISON:  Lumbar spine CT and lumbar spine MRI obtained earlier today.   FINDINGS: Three C-arm images of the upper lumbar spine demonstrate bilateral pedicle screw placement at the L1 and L3 levels and right pedicle screw placement at the L2 level. Normal alignment.   IMPRESSION: Operative changes, as described above.     Electronically Signed   By: Claudie Revering M.D.   On: 08/27/2021 15:03  PATIENT SURVEYS:  {rehab surveys:24030}  SCREENING FOR RED FLAGS: Bowel or bladder incontinence: [id number] Spinal tumors: {Yes/No:304960894} Cauda  equina syndrome: {Yes/No:304960894} Compression fracture: {Yes/No:304960894} Abdominal aneurysm: {Yes/No:304960894}  COGNITION: Overall cognitive status: {cognition:24006}     SENSATION: {sensation:27233}  MUSCLE LENGTH: Hamstrings: Right *** deg; Left *** deg Marcello Moores test: Right *** deg; Left *** deg  POSTURE: {posture:25561}  PALPATION: ***  LUMBAR ROM:   AROM eval  Flexion   Extension   Right lateral flexion   Left lateral flexion   Right rotation   Left rotation    (Blank rows = not tested)  LOWER EXTREMITY ROM:     {AROM/PROM:27142}  Right eval Left eval  Hip flexion    Hip extension    Hip abduction    Hip adduction    Hip internal rotation    Hip external rotation    Knee flexion    Knee extension    Ankle dorsiflexion    Ankle plantarflexion    Ankle inversion    Ankle eversion     (Blank rows = not tested)  LOWER EXTREMITY MMT:    MMT Right eval Left eval  Hip flexion    Hip extension    Hip abduction    Hip adduction    Hip internal rotation    Hip external rotation    Knee flexion    Knee extension    Ankle dorsiflexion    Ankle plantarflexion    Ankle inversion    Ankle eversion     (Blank rows = not tested)  LUMBAR  SPECIAL TESTS:  {lumbar special test:25242}  FUNCTIONAL TESTS:  {Functional tests:24029}  GAIT: Distance walked: *** Assistive device utilized: {Assistive devices:23999} Level of assistance: {Levels of assistance:24026} Comments: ***  TODAY'S TREATMENT:                                                                                                                              DATE: ***    PATIENT EDUCATION:  Education details: *** Person educated: {Person educated:25204} Education method: {Education Method:25205} Education comprehension: {Education Comprehension:25206}  HOME EXERCISE PROGRAM: ***  ASSESSMENT:  CLINICAL IMPRESSION: Patient is a *** y.o. *** who was seen today for physical therapy evaluation and treatment for ***.   OBJECTIVE IMPAIRMENTS: {opptimpairments:25111}.   ACTIVITY LIMITATIONS: {activitylimitations:27494}  PARTICIPATION LIMITATIONS: {participationrestrictions:25113}  PERSONAL FACTORS: {Personal factors:25162} are also affecting patient's functional outcome.   REHAB POTENTIAL: {rehabpotential:25112}  CLINICAL DECISION MAKING: {clinical decision making:25114}  EVALUATION COMPLEXITY: {Evaluation complexity:25115}   GOALS: Goals reviewed with patient? {yes/no:20286}  SHORT TERM GOALS: Target date: ***  *** Baseline: Goal status: {GOALSTATUS:25110}  2.  *** Baseline:  Goal status: {GOALSTATUS:25110}  3.  *** Baseline:  Goal status: {GOALSTATUS:25110}  4.  *** Baseline:  Goal status: {GOALSTATUS:25110}  5.  *** Baseline:  Goal status: {GOALSTATUS:25110}  6.  *** Baseline:  Goal status: {GOALSTATUS:25110}  LONG TERM GOALS: Target date: ***  *** Baseline:  Goal status: {GOALSTATUS:25110}  2.  *** Baseline:  Goal status: {GOALSTATUS:25110}  3.  *** Baseline:  Goal status: {GOALSTATUS:25110}  4.  *** Baseline:  Goal status: {GOALSTATUS:25110}  5.  *** Baseline:  Goal status: {GOALSTATUS:25110}  6.   *** Baseline:  Goal status: {GOALSTATUS:25110}  PLAN:  PT FREQUENCY: {rehab frequency:25116}  PT DURATION: {rehab duration:25117}  PLANNED INTERVENTIONS: {rehab planned interventions:25118::"Therapeutic exercises","Therapeutic activity","Neuromuscular re-education","Balance training","Gait training","Patient/Family education","Self Care","Joint mobilization"}.  PLAN FOR NEXT SESSION: ***   Lanice Shirts, PT 04/04/2022, 3:55 PM

## 2022-04-05 ENCOUNTER — Ambulatory Visit: Payer: Medicaid Other

## 2022-04-10 DIAGNOSIS — S32028D Other fracture of second lumbar vertebra, subsequent encounter for fracture with routine healing: Secondary | ICD-10-CM | POA: Diagnosis not present

## 2022-07-07 DIAGNOSIS — R634 Abnormal weight loss: Secondary | ICD-10-CM | POA: Diagnosis not present

## 2022-07-07 DIAGNOSIS — F5101 Primary insomnia: Secondary | ICD-10-CM | POA: Diagnosis not present

## 2022-07-07 DIAGNOSIS — F32A Depression, unspecified: Secondary | ICD-10-CM | POA: Diagnosis not present

## 2022-07-07 DIAGNOSIS — Z6821 Body mass index (BMI) 21.0-21.9, adult: Secondary | ICD-10-CM | POA: Diagnosis not present

## 2022-07-07 DIAGNOSIS — F17210 Nicotine dependence, cigarettes, uncomplicated: Secondary | ICD-10-CM | POA: Diagnosis not present

## 2022-07-07 DIAGNOSIS — F17200 Nicotine dependence, unspecified, uncomplicated: Secondary | ICD-10-CM | POA: Diagnosis not present

## 2022-08-10 ENCOUNTER — Other Ambulatory Visit (INDEPENDENT_AMBULATORY_CARE_PROVIDER_SITE_OTHER): Payer: Self-pay | Admitting: Primary Care

## 2022-08-10 DIAGNOSIS — W108XXS Fall (on) (from) other stairs and steps, sequela: Secondary | ICD-10-CM

## 2022-08-10 DIAGNOSIS — S32028D Other fracture of second lumbar vertebra, subsequent encounter for fracture with routine healing: Secondary | ICD-10-CM | POA: Diagnosis not present

## 2022-10-17 DIAGNOSIS — R634 Abnormal weight loss: Secondary | ICD-10-CM | POA: Diagnosis not present

## 2022-10-17 DIAGNOSIS — F32A Depression, unspecified: Secondary | ICD-10-CM | POA: Diagnosis not present

## 2022-10-17 DIAGNOSIS — E559 Vitamin D deficiency, unspecified: Secondary | ICD-10-CM | POA: Diagnosis not present

## 2022-10-17 DIAGNOSIS — R5383 Other fatigue: Secondary | ICD-10-CM | POA: Diagnosis not present

## 2022-10-17 DIAGNOSIS — F17210 Nicotine dependence, cigarettes, uncomplicated: Secondary | ICD-10-CM | POA: Diagnosis not present

## 2022-10-17 DIAGNOSIS — F5101 Primary insomnia: Secondary | ICD-10-CM | POA: Diagnosis not present

## 2022-10-17 DIAGNOSIS — E7800 Pure hypercholesterolemia, unspecified: Secondary | ICD-10-CM | POA: Diagnosis not present

## 2022-10-17 DIAGNOSIS — F17200 Nicotine dependence, unspecified, uncomplicated: Secondary | ICD-10-CM | POA: Diagnosis not present

## 2023-01-18 DIAGNOSIS — F32A Depression, unspecified: Secondary | ICD-10-CM | POA: Diagnosis not present

## 2023-01-18 DIAGNOSIS — Z6822 Body mass index (BMI) 22.0-22.9, adult: Secondary | ICD-10-CM | POA: Diagnosis not present

## 2023-01-18 DIAGNOSIS — E559 Vitamin D deficiency, unspecified: Secondary | ICD-10-CM | POA: Diagnosis not present

## 2023-01-18 DIAGNOSIS — R5383 Other fatigue: Secondary | ICD-10-CM | POA: Diagnosis not present

## 2023-01-18 DIAGNOSIS — E7800 Pure hypercholesterolemia, unspecified: Secondary | ICD-10-CM | POA: Diagnosis not present

## 2023-01-18 DIAGNOSIS — R634 Abnormal weight loss: Secondary | ICD-10-CM | POA: Diagnosis not present

## 2023-01-18 DIAGNOSIS — F5101 Primary insomnia: Secondary | ICD-10-CM | POA: Diagnosis not present

## 2023-01-18 DIAGNOSIS — F17210 Nicotine dependence, cigarettes, uncomplicated: Secondary | ICD-10-CM | POA: Diagnosis not present

## 2023-01-18 DIAGNOSIS — F17200 Nicotine dependence, unspecified, uncomplicated: Secondary | ICD-10-CM | POA: Diagnosis not present

## 2023-07-02 DIAGNOSIS — Z23 Encounter for immunization: Secondary | ICD-10-CM | POA: Diagnosis not present

## 2023-07-02 DIAGNOSIS — M79644 Pain in right finger(s): Secondary | ICD-10-CM | POA: Diagnosis not present

## 2023-07-02 DIAGNOSIS — L03011 Cellulitis of right finger: Secondary | ICD-10-CM | POA: Diagnosis not present

## 2023-11-13 ENCOUNTER — Encounter: Payer: Self-pay | Admitting: Obstetrics and Gynecology

## 2023-12-06 ENCOUNTER — Other Ambulatory Visit: Payer: Self-pay

## 2023-12-06 ENCOUNTER — Ambulatory Visit: Payer: Self-pay | Admitting: Advanced Practice Midwife

## 2023-12-06 ENCOUNTER — Other Ambulatory Visit (HOSPITAL_COMMUNITY)
Admission: RE | Admit: 2023-12-06 | Discharge: 2023-12-06 | Disposition: A | Discharge status: Home / Self Care | Source: Ambulatory Visit | Attending: Advanced Practice Midwife | Admitting: Advanced Practice Midwife

## 2023-12-06 ENCOUNTER — Encounter: Payer: Self-pay | Admitting: Advanced Practice Midwife

## 2023-12-06 VITALS — BP 115/77 | HR 66 | Ht 66.0 in | Wt 129.0 lb

## 2023-12-06 DIAGNOSIS — Z01419 Encounter for gynecological examination (general) (routine) without abnormal findings: Secondary | ICD-10-CM | POA: Diagnosis not present

## 2023-12-06 DIAGNOSIS — Z803 Family history of malignant neoplasm of breast: Secondary | ICD-10-CM

## 2023-12-06 DIAGNOSIS — N946 Dysmenorrhea, unspecified: Secondary | ICD-10-CM | POA: Diagnosis not present

## 2023-12-06 DIAGNOSIS — Z113 Encounter for screening for infections with a predominantly sexual mode of transmission: Secondary | ICD-10-CM

## 2023-12-07 LAB — CERVICOVAGINAL ANCILLARY ONLY
Bacterial Vaginitis (gardnerella): POSITIVE — AB
Candida Glabrata: NEGATIVE
Candida Vaginitis: NEGATIVE
Chlamydia: NEGATIVE
Comment: NEGATIVE
Comment: NEGATIVE
Comment: NEGATIVE
Comment: NEGATIVE
Comment: NEGATIVE
Comment: NORMAL
Neisseria Gonorrhea: NEGATIVE
Trichomonas: NEGATIVE

## 2023-12-10 ENCOUNTER — Ambulatory Visit: Payer: Self-pay | Admitting: Advanced Practice Midwife

## 2023-12-10 DIAGNOSIS — B9689 Other specified bacterial agents as the cause of diseases classified elsewhere: Secondary | ICD-10-CM

## 2023-12-10 MED ORDER — METRONIDAZOLE 500 MG PO TABS: 500.0000 mg | ORAL_TABLET | Freq: Two times a day (BID) | ORAL | 0 refills | Status: AC

## 2023-12-10 NOTE — Progress Notes (Unsigned)
 GYNECOLOGY OFFICE VISIT NOTE  History:   Courtney Mendez is a 33 y.o. G0P0000 here today for ***.  Discussed the use of AI scribe software for clinical note transcription with the patient, who gave verbal consent to proceed.  History of Present Illness Courtney Mendez is a 33 year old female who presents for a well woman appointment and to discuss a hysterectomy due to painful and irregular periods.  She reports longstanding irregular and painful periods that worsened after back surgery. Flow ranges from very light to very heavy, but pain is consistently severe and at times prevents her from getting out of bed. She has not tried medical therapy or other treatments for her menstrual symptoms and has had no prior evaluation.  Her cycle timing has shifted from the beginning of the month to the end, with occasional two periods per month. Bleeding typically lasts about five days.  She has a strong paternal family history of breast cancer in several aunts. She denies breast masses, skin changes, drainage or bleeding from nipples.   She reports no prior abnormal Pap smears and is unsure of her HPV vaccination status.  She has rods in her back with associated muscle spasms that can worsen her menstrual pain. She denies pain with sexual intercourse.   She denies any abnormal vaginal discharge, bleeding, pelvic pain or other concerns.   Family Planning: Same sex partner.    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   Health Maintenance:  Unsure when last Pap was or result.  Mammogram: None  Review of Systems:   Pertinent items noted in HPI and remainder of comprehensive ROS otherwise negative.  Physical Exam:  BP 115/77   Pulse 66   Ht 5' 6 (1.676 m)   Wt 129 lb (58.5 kg)   LMP 11/26/2023 (Within Days)   BMI 20.82 kg/m  CONSTITUTIONAL: Well-developed, well-nourished female in no acute distress.  HEENT:  Normocephalic, atraumatic. No scleral icterus.  NECK: Normal range of motion, supple, no masses noted on observation SKIN: No rash noted. Not diaphoretic. No erythema. No pallor. MUSCULOSKELETAL: Normal range of motion. No edema noted. NEUROLOGIC: Alert and oriented to person, place, and time. Normal muscle tone coordination. No cranial nerve deficit noted. PSYCHIATRIC: Normal mood and affect. Normal behavior. Normal judgment and thought content. CARDIOVASCULAR: Normal heart rate noted RESPIRATORY: Effort and breath sounds normal, no problems with respiration noted ABDOMEN: No masses or other overt distention noted on observation, no tenderness.   PELVIC: {Blank single:19197::Deferred,Normal appearing external genitalia; normal urethral meatus; normal appearing vaginal mucosa and cervix.  No abnormal discharge noted.  Normal uterine size, no other palpable masses, no uterine or adnexal tenderness. Performed in the presence of a chaperone} Physical Exam GENERAL: Alert, cooperative, well developed, no acute distress. HEENT: Normocephalic, moist mucous membranes. CHEST: Normal respiratory rat and effort CARDIOVASCULAR: Normal heart rate  BREASTS: No masses, dimpling of skin, lymphadenopathy, retraction of or drainage from nipples. ABDOMEN: Soft, non-tender, non-distended, without organomegaly. Normal bowel sounds. GENITOURINARY: Cervix visualized with speculum, no abnormalities. Uterus and ovaries palpated, no abnormalities.Chaperone present.  EXTREMITIES: No cyanosis  or edema. NEUROLOGICAL: Cranial nerves grossly intact. Moves all extremities without gross motor or sensory deficit.  Labs  and Imaging NA  No results found.    Assessment and Plan:     Assessment and Plan Assessment & Plan Well Woman Visit Routine visit with abnormal findings. Family history of breast cancer noted. Discussed HPV vaccination and genetic counseling for cancer risk assessment. - Performed Pap smear. - Scheduled genetic counseling for cancer risk assessment. - Discussed exploring HPV vaccination status and records.  Dysmenorrhea and abnormal menstrual bleeding Chronic dysmenorrhea with variable bleeding. No prior Dx ovarian cysts or endometriosis. Nml exam. Pain worsened by back surgery. Ultrasound needed before considering further management options. Discussed hysterectomy risks and that there may be effective lower-intervention options that carry less risk. She states she does not want to get pregnant.  - Ordered pelvic ultrasound to evaluate for ovarian cysts or other abnormalities. - Scheduled follow-up with surgeon to discuss hysterectomy options after ultrasound results.  Screening for sexually transmitted infections Comprehensive STI screening requested. Discussed additional blood tests for HIV, syphilis, hepatitis B, and C. - Performed STI screening including yeast infection, bacterial vaginosis, gonorrhea, chlamydia, and trichomonas. - Offered blood tests for HIV, syphilis, hepatitis B, and C. Will consider at next appt.      1. Dysmenorrhea (Primary) - US  PELVIC COMPLETE WITH TRANSVAGINAL; Future  2. Well woman exam with routine gynecological exam - Cytology - PAP( [place]) - Cervicovaginal ancillary only( [place])  3. Routine screening for STI (sexually transmitted infection) - Cytology - PAP( [place]) - Cervicovaginal ancillary only( )   Routine preventative health maintenance measures emphasized. Please refer to After Visit Summary for other counseling recommendations.   Return in about 1 month (around 01/06/2024) for Dr. Jeralyn or an female  surgeon .    I spent {5-60:60395} minutes dedicated to the care of this patient including pre-visit review of records, face to face time with the patient discussing her conditions and treatments, post visit ordering of medications and appropriate tests or procedures, coordinating care and documenting this visit encounter.    Roxie Kreeger  Claudene HOWARD 12/10/2023 12:59 AM [facility] [employer], [facility]

## 2023-12-11 LAB — CYTOLOGY - PAP
Comment: NEGATIVE
Diagnosis: UNDETERMINED — AB
High risk HPV: NEGATIVE

## 2023-12-13 ENCOUNTER — Ambulatory Visit (HOSPITAL_COMMUNITY)

## 2023-12-24 ENCOUNTER — Ambulatory Visit (HOSPITAL_COMMUNITY)

## 2024-01-07 ENCOUNTER — Ambulatory Visit (HOSPITAL_COMMUNITY)
Admission: RE | Admit: 2024-01-07 | Discharge: 2024-01-07 | Disposition: A | Discharge status: Home / Self Care | Source: Ambulatory Visit | Attending: Advanced Practice Midwife | Admitting: Advanced Practice Midwife

## 2024-01-07 DIAGNOSIS — N946 Dysmenorrhea, unspecified: Secondary | ICD-10-CM | POA: Insufficient documentation

## 2024-01-15 ENCOUNTER — Encounter: Payer: Self-pay | Admitting: Genetic Counselor

## 2024-01-17 ENCOUNTER — Inpatient Hospital Stay

## 2024-01-17 ENCOUNTER — Telehealth: Payer: Self-pay | Admitting: Advanced Practice Midwife

## 2024-01-17 ENCOUNTER — Inpatient Hospital Stay: Attending: Genetic Counselor | Admitting: Genetic Counselor

## 2024-01-17 NOTE — Telephone Encounter (Signed)
 Good Morning, courteous notification of patient being NO Show for genetic referral today at 10:00am- she was spoken to on yesterday & she confirmed but did not show today. She was called this morning - we left a voicemail message to call us  to see if she wishes to reschedule. Thank you and Take Care

## 2024-01-29 ENCOUNTER — Ambulatory Visit: Admitting: Obstetrics and Gynecology

## 2024-02-25 ENCOUNTER — Inpatient Hospital Stay
